# Patient Record
Sex: Male | Born: 1955 | Race: White | Hispanic: No | Marital: Married | Smoking: Never smoker
Health system: Southern US, Community
[De-identification: ages and names within clinical notes are randomized; demographics above are authoritative.]

## PROBLEM LIST (undated history)

## (undated) DIAGNOSIS — Z9889 Other specified postprocedural states: Secondary | ICD-10-CM

## (undated) DIAGNOSIS — R011 Cardiac murmur, unspecified: Secondary | ICD-10-CM

## (undated) DIAGNOSIS — K219 Gastro-esophageal reflux disease without esophagitis: Secondary | ICD-10-CM

## (undated) DIAGNOSIS — N200 Calculus of kidney: Secondary | ICD-10-CM

## (undated) DIAGNOSIS — I10 Essential (primary) hypertension: Secondary | ICD-10-CM

## (undated) DIAGNOSIS — C4401 Basal cell carcinoma of skin of lip: Secondary | ICD-10-CM

## (undated) DIAGNOSIS — E785 Hyperlipidemia, unspecified: Secondary | ICD-10-CM

## (undated) DIAGNOSIS — N390 Urinary tract infection, site not specified: Secondary | ICD-10-CM

## (undated) DIAGNOSIS — J45909 Unspecified asthma, uncomplicated: Secondary | ICD-10-CM

## (undated) DIAGNOSIS — B019 Varicella without complication: Secondary | ICD-10-CM

## (undated) HISTORY — DX: Other specified postprocedural states: Z98.890

## (undated) HISTORY — DX: Cardiac murmur, unspecified: R01.1

## (undated) HISTORY — DX: Essential (primary) hypertension: I10

## (undated) HISTORY — DX: Varicella without complication: B01.9

## (undated) HISTORY — DX: Unspecified asthma, uncomplicated: J45.909

## (undated) HISTORY — DX: Basal cell carcinoma of skin of lip: C44.01

## (undated) HISTORY — DX: Calculus of kidney: N20.0

## (undated) HISTORY — DX: Gastro-esophageal reflux disease without esophagitis: K21.9

## (undated) HISTORY — DX: Urinary tract infection, site not specified: N39.0

## (undated) HISTORY — DX: Hyperlipidemia, unspecified: E78.5

---
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
## 2006-07-31 HISTORY — PX: CARDIAC CATHETERIZATION: SHX172

## 2007-08-01 HISTORY — PX: OTHER SURGICAL HISTORY: SHX169

## 2013-05-07 ENCOUNTER — Encounter: Payer: Self-pay | Admitting: Internal Medicine

## 2013-05-07 ENCOUNTER — Ambulatory Visit (INDEPENDENT_AMBULATORY_CARE_PROVIDER_SITE_OTHER): Payer: 59 | Admitting: Internal Medicine

## 2013-05-07 ENCOUNTER — Encounter (INDEPENDENT_AMBULATORY_CARE_PROVIDER_SITE_OTHER): Payer: Self-pay

## 2013-05-07 VITALS — BP 150/90 | HR 83 | Temp 98.1°F | Ht 70.5 in | Wt 217.0 lb

## 2013-05-07 DIAGNOSIS — K219 Gastro-esophageal reflux disease without esophagitis: Secondary | ICD-10-CM

## 2013-05-07 DIAGNOSIS — E785 Hyperlipidemia, unspecified: Secondary | ICD-10-CM

## 2013-05-07 DIAGNOSIS — R011 Cardiac murmur, unspecified: Secondary | ICD-10-CM | POA: Insufficient documentation

## 2013-05-07 DIAGNOSIS — I10 Essential (primary) hypertension: Secondary | ICD-10-CM | POA: Insufficient documentation

## 2013-05-07 DIAGNOSIS — R030 Elevated blood-pressure reading, without diagnosis of hypertension: Secondary | ICD-10-CM | POA: Insufficient documentation

## 2013-05-07 DIAGNOSIS — Z23 Encounter for immunization: Secondary | ICD-10-CM

## 2013-05-07 DIAGNOSIS — IMO0001 Reserved for inherently not codable concepts without codable children: Secondary | ICD-10-CM | POA: Insufficient documentation

## 2013-05-07 DIAGNOSIS — J45909 Unspecified asthma, uncomplicated: Secondary | ICD-10-CM | POA: Insufficient documentation

## 2013-05-07 NOTE — Progress Notes (Signed)
Chief Complaint  Patient presents with  . Establish Care    Moved from [place] in April.  Seeing his wife.    HPI: Patient comes in as new patient visit . Previous care was  In [place] last labs April  BP this am 136/84  At home    ? Some white coat  Effect.   Moved from  [place]  .   Lipids  For at least 10 years   40 mg  270 total ROS: See pertinent positives and negatives per HPI. Contacts and some color blindness  Has used inhalers before  Seasonal SOB and cold with  Prolonged coughs has had allergy shots in the past.   Better down here.  ? Rad Has gerd.  For 10 years    Endoscope 4 years ago .  No barretts   Hard to wean.  Heart murmur since birth.    Ed visit sob in 2011  Normal  CCath  Hx shot putting  And other exercise. Wine 4-5 per week.  Sibs in [place] well ROS:  GEN/ HEENT: No fever, significant weight changes sweats headaches vision problems hearing changes, CV/ PULM; No chest pain shortness of breath cough, syncope,edema  change in exercise tolerance. GI /GU: No adominal pain, vomiting, change in bowel habits. No blood in the stool. No significant GU symptoms. SKIN/HEME: ,no acute skin rashes suspicious lesions or bleeding. No lymphadenopathy, nodules, masses.  NEURO/ PSYCH:  No neurologic signs such as weakness numbness. No depression anxiety. IMM/ Allergy: No unusual infections. As above  REST of 12 system review negative except as per HPI   Past Medical History  Diagnosis Date  . GERD (gastroesophageal reflux disease)     endo neg x 2? on  chornnic ppi  . Hyperlipidemia     orig  270 ranges  on meds for 10 years   . Asthma     hx allergy shots in [place]   . Chicken pox   . Heart murmur     cardiac cath neg for sob   . Hypertension     white coat   . UTI (lower urinary tract infection)     x 1 with fever   . H/O cardiac catheterization     neg wu for sob  in ed    Past Surgical History  Procedure Laterality Date  . Shoulder repair      2009  .  Tonsillectomy      1963  . Cardiac catheterization       Family History  Problem Relation Age of Onset  . Arthritis Father   . Hyperlipidemia Father   . Aneurysm Father   . Graves' disease Sister     History   Social History  . Marital Status: Married    Spouse Name: N/A    Number of Children: N/A  . Years of Education: N/A   Social History Main Topics  . Smoking status: Never Smoker   . Smokeless tobacco: None  . Alcohol Use: Yes  . Drug Use: None  . Sexual Activity: None   Other Topics Concern  . None   Social History Narrative   7 hours of sleep per night   Patient lives with his wife.   [place] Reason:  Moved from [place].    Job Therapist, nutritional ;. 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   Brother good health  Used to do weigh lifting now does som shot put     Outpatient Encounter Prescriptions as of 05/07/2013  Medication Sig Dispense Refill  . omeprazole (PRILOSEC) 20 MG capsule Take 20 mg by mouth daily.      . simvastatin (ZOCOR) 40 MG tablet Take 40 mg by mouth every evening.       No facility-administered encounter medications on file as of 05/07/2013.    EXAM:  BP 150/90  Pulse 83  Temp(Src) 98.1 F (36.7 C) (Oral)  Ht 5' 10.5" (1.791 m)  Wt 217 lb (98.431 kg)  BMI 30.69 kg/m2  SpO2 98%  Body mass index is 30.69 kg/(m^2). Repeat bp large cuff was 142/90 GENERAL: vitals reviewed and listed above, alert, oriented, appears well hydrated and in no acute distress HEENT: atraumatic, conjunctiva  clear, no obvious abnormalities on inspection of external nose and ears NECK: no obvious masses on inspection palpation  No bruits masses adenopathy  LUNGS: clear to auscultation bilaterally, no wheezes, rales or rhonchi, good air movement CV: HRRR,  No g or m heard  Today no clubbing cyanosis or  peripheral edema nl cap refill  Abdomen:  Sof,t normal bowel sounds without hepatosplenomegaly, no guarding rebound or masses no CVA  tenderness Skin no acute rash some follicular rash MS: moves all extremities without noticeable focal  abnormality PSYCH: pleasant and cooperative, no obvious depression or anxiety  ASSESSMENT AND PLAN:  Discussed the following assessment and plan:  Hyperlipidemia - labs form april 14 at goal   Elevated blood pressure reading  Need for prophylactic vaccination and inoculation against influenza - Plan: Flu Vaccine QUAD 36+ mos PF IM (Fluarix)  GERD (gastroesophageal reflux disease) - on chronic ppi neg endo  Heart murmur - not heard today  neg cath in past  Asthma - rad allergic related?  White coat hypertension  -Patient advised to return or notify health care team  if symptoms worsen or persist or new concerns arise.  Patient Instructions  Check BP readings at least 3 x per week.  More if you would like AND RECORD . Mix aerobic into your  exercise mix.    Agree with healthy weight loss. Repeat bp large cuff is 142/90 Bring your machine and readings for OV in about 6 weeks.     .  How to Take Your Blood Pressure  These instructions are only for electronic home blood pressure machines. You will need:   An automatic or semi-automatic blood pressure machine.  Fresh batteries for the blood pressure machine. HOW DO I USE THESE TOOLS TO CHECK MY BLOOD PRESSURE?   There are 2 numbers that make up your blood pressure. For example: 120/80.  The first number (120 in our example) is called the "systolic pressure." It is a measure of the pressure in your blood vessels when your heart is pumping blood.  The second number (80 in our example) is called the "diastolic pressure." It is a measure of the pressure in your blood vessels when your heart is resting between beats.  Before you buy a home blood pressure machine, check the size of your arm so you can buy the right size cuff. Here is how to check the size of your arm:  Use a tape measure that shows both inches and  centimeters.  Wrap the tape measure around the middle upper part of your arm. You may need someone to help you measure right.  Write down your arm measurement in both inches and centimeters.  To measure your blood pressure right, it is important to have the right size cuff.  If your arm is up to 13 inches (37 to 34 centimeters), get an adult cuff size.  If your arm is 13 to 17 inches (35 to 44 centimeters), get a large adult cuff size.  If your arm is 17 to 20 inches (45 to 52 centimeters), get an adult thigh cuff.  Try to rest or relax for at least 30 minutes before you check your blood pressure.  Do not smoke.  Do not have any drinks with caffeine, such as:  Pop.  Coffee.  Tea.  Check your blood pressure in a quiet room.  Sit down and stretch out your arm on a table. Keep your arm at about the level of your heart. Let your arm relax. GETTING BLOOD PRESSURE READINGS  Make sure you remove any tight-fighting clothing from your arm. Wrap the cuff around your upper arm. Wrap it just above the bend, and above where you felt the pulse. You should be able to slip a finger between the cuff and your arm. If you cannot slip a finger in the cuff, it is too tight and should be removed and rewrapped.  Some units requires you to manually pump up the arm cuff.  Automatic units inflate the cuff when you press a button.  Cuff deflation is automatic in both models.  After the cuff is inflated, the unit measures your blood pressure and pulse. The readings are displayed on a monitor. Hold still and breathe normally while the cuff is inflated.  Getting a reading takes less than a minute.  Some models store readings in a memory. Some provide a printout of readings.  Get readings at different times of the day. You should wait at least 5 minutes between readings. Take readings with you to your next doctor's visit. Document Released: 06/29/2008 Document Revised: 10/09/2011 Document Reviewed:  06/29/2008 [facility] Patient Information 2014 [place], [place].  Managing Your High Blood Pressure Blood pressure is a measurement of how forceful your blood is pressing against the walls of the arteries. Arteries are muscular tubes within the circulatory system. Blood pressure does not stay the same. Blood pressure rises when you are active, excited, or nervous; and it lowers during sleep and relaxation. If the numbers measuring your blood pressure stay above normal most of the time, you are at risk for health problems. High blood pressure (hypertension) is a long-term (chronic) condition in which blood pressure is elevated. A blood pressure reading is recorded as two numbers, such as 120 over 80 (or 120/80). The first, higher number is called the systolic pressure. It is a measure of the pressure in your arteries as the heart beats. The second, lower number is called the diastolic pressure. It is a measure of the pressure in your arteries as the heart relaxes between beats.  Keeping your blood pressure in a normal range is important to your overall health and prevention of health problems, such as heart disease and stroke. When your blood pressure is uncontrolled, your heart has to work harder than normal. High blood pressure is a very common condition in adults because blood pressure tends to rise with age. Men and women are equally likely to have hypertension but at different times in life. Before age 66, men are more likely to have hypertension. After 56 years of age, women are more likely to have it. Hypertension is especially common in African Americans. This condition often has no  signs or symptoms. The cause of the condition is usually not known. Your caregiver can help you come up with a plan to keep your blood pressure in a normal, healthy range. BLOOD PRESSURE STAGES Blood pressure is classified into four stages: normal, prehypertension, stage 1, and stage 2. Your blood pressure reading will be  used to determine what type of treatment, if any, is necessary. Appropriate treatment options are tied to these four stages:  Normal  Systolic pressure (mm Hg): below 120.  Diastolic pressure (mm Hg): below 80. Prehypertension  Systolic pressure (mm Hg): 120 to 139.  Diastolic pressure (mm Hg): 80 to 89. Stage1  Systolic pressure (mm Hg): 140 to 159.  Diastolic pressure (mm Hg): 90 to 99. Stage2  Systolic pressure (mm Hg): 160 or above.  Diastolic pressure (mm Hg): 100 or above. RISKS RELATED TO HIGH BLOOD PRESSURE Managing your blood pressure is an important responsibility. Uncontrolled high blood pressure can lead to:  A heart attack.  A stroke.  A weakened blood vessel (aneurysm).  Heart failure.  Kidney damage.  Eye damage.  Metabolic syndrome.  Memory and concentration problems. HOW TO MANAGE YOUR BLOOD PRESSURE Blood pressure can be managed effectively with lifestyle changes and medicines (if needed). Your caregiver will help you come up with a plan to bring your blood pressure within a normal range. Your plan should include the following: Education  Read all information provided by your caregivers about how to control blood pressure.  Educate yourself on the latest guidelines and treatment recommendations. New research is always being done to further define the risks and treatments for high blood pressure. Lifestylechanges  Control your weight.  Avoid smoking.  Stay physically active.  Reduce the amount of salt in your diet.  Reduce stress.  Control any chronic conditions, such as high cholesterol or diabetes.  Reduce your alcohol intake. Medicines  Several medicines (antihypertensive medicines) are available, if needed, to bring blood pressure within a normal range. Communication  Review all the medicines you take with your caregiver because there may be side effects or interactions.  Talk with your caregiver about your diet, exercise  habits, and other lifestyle factors that may be contributing to high blood pressure.  See your caregiver regularly. Your caregiver can help you create and adjust your plan for managing high blood pressure. RECOMMENDATIONS FOR TREATMENT AND FOLLOW-UP  The following recommendations are based on current guidelines for managing high blood pressure in nonpregnant adults. Use these recommendations to identify the proper follow-up period or treatment option based on your blood pressure reading. You can discuss these options with your caregiver.  Systolic pressure of 120 to 139 or diastolic pressure of 80 to 89: Follow up with your caregiver as directed.  Systolic pressure of 140 to 160 or diastolic pressure of 90 to 100: Follow up with your caregiver within 2 months.  Systolic pressure above 160 or diastolic pressure above 100: Follow up with your caregiver within 1 month.  Systolic pressure above 180 or diastolic pressure above 110: Consider antihypertensive therapy; follow up with your caregiver within 1 week.  Systolic pressure above 200 or diastolic pressure above 120: Begin antihypertensive therapy; follow up with your caregiver within 1 week. Document Released: 04/10/2012 Document Reviewed: 04/10/2012 [facility] Patient Information 2014 [place], [place].      Neta Mends. Panosh M.D.

## 2013-05-07 NOTE — Patient Instructions (Addendum)
Check BP readings at least 3 x per week.  More if you would like AND RECORD . Mix aerobic into your  exercise mix.    Agree with healthy weight loss. Repeat bp large cuff is 142/90 Bring your machine and readings for OV in about 6 weeks.     .  How to Take Your Blood Pressure  These instructions are only for electronic home blood pressure machines. You will need:   An automatic or semi-automatic blood pressure machine.  Fresh batteries for the blood pressure machine. HOW DO I USE THESE TOOLS TO CHECK MY BLOOD PRESSURE?   There are 2 numbers that make up your blood pressure. For example: 120/80.  The first number (120 in our example) is called the "systolic pressure." It is a measure of the pressure in your blood vessels when your heart is pumping blood.  The second number (80 in our example) is called the "diastolic pressure." It is a measure of the pressure in your blood vessels when your heart is resting between beats.  Before you buy a home blood pressure machine, check the size of your arm so you can buy the right size cuff. Here is how to check the size of your arm:  Use a tape measure that shows both inches and centimeters.  Wrap the tape measure around the middle upper part of your arm. You may need someone to help you measure right.  Write down your arm measurement in both inches and centimeters.  To measure your blood pressure right, it is important to have the right size cuff.  If your arm is up to 13 inches (37 to 34 centimeters), get an adult cuff size.  If your arm is 13 to 17 inches (35 to 44 centimeters), get a large adult cuff size.  If your arm is 17 to 20 inches (45 to 52 centimeters), get an adult thigh cuff.  Try to rest or relax for at least 30 minutes before you check your blood pressure.  Do not smoke.  Do not have any drinks with caffeine, such as:  Pop.  Coffee.  Tea.  Check your blood pressure in a quiet room.  Sit down and stretch out your  arm on a table. Keep your arm at about the level of your heart. Let your arm relax. GETTING BLOOD PRESSURE READINGS  Make sure you remove any tight-fighting clothing from your arm. Wrap the cuff around your upper arm. Wrap it just above the bend, and above where you felt the pulse. You should be able to slip a finger between the cuff and your arm. If you cannot slip a finger in the cuff, it is too tight and should be removed and rewrapped.  Some units requires you to manually pump up the arm cuff.  Automatic units inflate the cuff when you press a button.  Cuff deflation is automatic in both models.  After the cuff is inflated, the unit measures your blood pressure and pulse. The readings are displayed on a monitor. Hold still and breathe normally while the cuff is inflated.  Getting a reading takes less than a minute.  Some models store readings in a memory. Some provide a printout of readings.  Get readings at different times of the day. You should wait at least 5 minutes between readings. Take readings with you to your next doctor's visit. Document Released: 06/29/2008 Document Revised: 10/09/2011 Document Reviewed: 06/29/2008 [facility] Patient Information 2014 [place], [place].  Managing Your High Blood  Pressure Blood pressure is a measurement of how forceful your blood is pressing against the walls of the arteries. Arteries are muscular tubes within the circulatory system. Blood pressure does not stay the same. Blood pressure rises when you are active, excited, or nervous; and it lowers during sleep and relaxation. If the numbers measuring your blood pressure stay above normal most of the time, you are at risk for health problems. High blood pressure (hypertension) is a long-term (chronic) condition in which blood pressure is elevated. A blood pressure reading is recorded as two numbers, such as 120 over 80 (or 120/80). The first, higher number is called the systolic pressure. It is a  measure of the pressure in your arteries as the heart beats. The second, lower number is called the diastolic pressure. It is a measure of the pressure in your arteries as the heart relaxes between beats.  Keeping your blood pressure in a normal range is important to your overall health and prevention of health problems, such as heart disease and stroke. When your blood pressure is uncontrolled, your heart has to work harder than normal. High blood pressure is a very common condition in adults because blood pressure tends to rise with age. Men and women are equally likely to have hypertension but at different times in life. Before age 66, men are more likely to have hypertension. After 56 years of age, women are more likely to have it. Hypertension is especially common in African Americans. This condition often has no signs or symptoms. The cause of the condition is usually not known. Your caregiver can help you come up with a plan to keep your blood pressure in a normal, healthy range. BLOOD PRESSURE STAGES Blood pressure is classified into four stages: normal, prehypertension, stage 1, and stage 2. Your blood pressure reading will be used to determine what type of treatment, if any, is necessary. Appropriate treatment options are tied to these four stages:  Normal  Systolic pressure (mm Hg): below 120.  Diastolic pressure (mm Hg): below 80. Prehypertension  Systolic pressure (mm Hg): 120 to 139.  Diastolic pressure (mm Hg): 80 to 89. Stage1  Systolic pressure (mm Hg): 140 to 159.  Diastolic pressure (mm Hg): 90 to 99. Stage2  Systolic pressure (mm Hg): 160 or above.  Diastolic pressure (mm Hg): 100 or above. RISKS RELATED TO HIGH BLOOD PRESSURE Managing your blood pressure is an important responsibility. Uncontrolled high blood pressure can lead to:  A heart attack.  A stroke.  A weakened blood vessel (aneurysm).  Heart failure.  Kidney damage.  Eye damage.  Metabolic  syndrome.  Memory and concentration problems. HOW TO MANAGE YOUR BLOOD PRESSURE Blood pressure can be managed effectively with lifestyle changes and medicines (if needed). Your caregiver will help you come up with a plan to bring your blood pressure within a normal range. Your plan should include the following: Education  Read all information provided by your caregivers about how to control blood pressure.  Educate yourself on the latest guidelines and treatment recommendations. New research is always being done to further define the risks and treatments for high blood pressure. Lifestylechanges  Control your weight.  Avoid smoking.  Stay physically active.  Reduce the amount of salt in your diet.  Reduce stress.  Control any chronic conditions, such as high cholesterol or diabetes.  Reduce your alcohol intake. Medicines  Several medicines (antihypertensive medicines) are available, if needed, to bring blood pressure within a normal range. Communication  Review  all the medicines you take with your caregiver because there may be side effects or interactions.  Talk with your caregiver about your diet, exercise habits, and other lifestyle factors that may be contributing to high blood pressure.  See your caregiver regularly. Your caregiver can help you create and adjust your plan for managing high blood pressure. RECOMMENDATIONS FOR TREATMENT AND FOLLOW-UP  The following recommendations are based on current guidelines for managing high blood pressure in nonpregnant adults. Use these recommendations to identify the proper follow-up period or treatment option based on your blood pressure reading. You can discuss these options with your caregiver.  Systolic pressure of 120 to 139 or diastolic pressure of 80 to 89: Follow up with your caregiver as directed.  Systolic pressure of 140 to 160 or diastolic pressure of 90 to 100: Follow up with your caregiver within 2 months.  Systolic  pressure above 160 or diastolic pressure above 100: Follow up with your caregiver within 1 month.  Systolic pressure above 180 or diastolic pressure above 110: Consider antihypertensive therapy; follow up with your caregiver within 1 week.  Systolic pressure above 200 or diastolic pressure above 120: Begin antihypertensive therapy; follow up with your caregiver within 1 week. Document Released: 04/10/2012 Document Reviewed: 04/10/2012 [facility] Patient Information 2014 [place], [place].

## 2013-06-18 ENCOUNTER — Ambulatory Visit (INDEPENDENT_AMBULATORY_CARE_PROVIDER_SITE_OTHER): Payer: 59 | Admitting: Internal Medicine

## 2013-06-18 ENCOUNTER — Encounter: Payer: Self-pay | Admitting: Internal Medicine

## 2013-06-18 VITALS — BP 144/90 | HR 75 | Temp 98.5°F | Wt 219.0 lb

## 2013-06-18 DIAGNOSIS — IMO0001 Reserved for inherently not codable concepts without codable children: Secondary | ICD-10-CM

## 2013-06-18 DIAGNOSIS — R030 Elevated blood-pressure reading, without diagnosis of hypertension: Secondary | ICD-10-CM

## 2013-06-18 DIAGNOSIS — I10 Essential (primary) hypertension: Secondary | ICD-10-CM

## 2013-06-18 NOTE — Assessment & Plan Note (Signed)
Review of  Log are basically normal readings Current machine and readings correlate with office readings. Adequate for making clinical decisions at this time. Follow

## 2013-06-18 NOTE — Progress Notes (Signed)
Chief Complaint  Patient presents with  . Follow-up    HPI: Patient comes in today as instructed for followup of his blood pressure readings felt to be white coat hypertension he brings in his monitor and list of readings orders associated pulse. He has 38 readings average left arm 1 34/81 average right arm 124/79 and pulse of 72. He has his machine with him today they're 7 readings above the 140/90 threshold. ROS: See pertinent positives and negatives per HPI. No chest pain shortness of breath feels well. Trying to walk more. Does still do lifting. States the only other time his blood pressure goes up his right is he gets ready to compete. After exercising however his blood pressures very good.  Past Medical History  Diagnosis Date  . GERD (gastroesophageal reflux disease)     endo neg x 2? on  chornnic ppi  . Hyperlipidemia     orig  270 ranges  on meds for 10 years   . Asthma     hx allergy shots in [place]   . Chicken pox   . Heart murmur     cardiac cath neg for sob   . Hypertension     white coat   . UTI (lower urinary tract infection)     x 1 with fever   . H/O cardiac catheterization     neg wu for sob  in ed     Family History  Problem Relation Age of Onset  . Arthritis Father   . Hyperlipidemia Father   . Aneurysm Father   . Graves' disease Sister     History   Social History  . Marital Status: Married    Spouse Name: N/A    Number of Children: N/A  . Years of Education: N/A   Social History Main Topics  . Smoking status: Never Smoker   . Smokeless tobacco: None  . Alcohol Use: Yes  . Drug Use: None  . Sexual Activity: None   Other Topics Concern  . None   Social History Narrative   7 hours of sleep per night   Patient lives with his wife.   Roylene Reason:  Moved from [place].    Job Therapist, nutritional ;. 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   Brother good health    Used to do weigh lifting now does som shot put      Outpatient Encounter Prescriptions as of 06/18/2013  Medication Sig  . omeprazole (PRILOSEC) 20 MG capsule Take 20 mg by mouth daily.  . simvastatin (ZOCOR) 40 MG tablet Take 40 mg by mouth every evening.    EXAM:  BP 144/90  Pulse 75  Temp(Src) 98.5 F (36.9 C) (Oral)  Wt 219 lb (99.338 kg)  SpO2 98%  Body mass index is 30.97 kg/(m^2).  GENERAL: vitals reviewed and listed above, alert, oriented, appears well hydrated and in no acute distress  Log reviewed blood pressure readings taken His machine left arm sitting 160/100 his machine second reading office machine 1 54/ 96. MS: moves all extremities without noticeable focal  abnormality PSYCH: pleasant and cooperative, no obvious depression or anxiety  ASSESSMENT AND PLAN:  Discussed the following assessment and plan:  Elevated blood pressure reading  White coat hypertension His machine his readings are certainly not under reading compared office reading. Therefore I feel that his home readings are accurate and precise enough to me clinical decisions. Since the majority of  his readings are normal as well as average normal we'll not add medication at this time continue lifestyle intervention periodic monitoring and contact us if elevations. Discussed implications of white coat hypertension that is not always the lowest risk. Plan preventive visit in April with labs contact us in the meantime if problems. -Patient advised to return or notify health care team  if symptoms worsen or persist or new concerns arise.  Patient Instructions  Monitor BP readings sporadically.    Contact us if rising abnormal.  otherwise Continue lifestyle intervention healthy eating and exercise . And wellness visit and  Labs then.    Neta Mends. Bonni Neuser M.D.  Pre visit review using our clinic review tool, if applicable. No additional management support is needed unless otherwise documented below in the visit note.

## 2013-06-18 NOTE — Patient Instructions (Signed)
Monitor BP readings sporadically.    Contact us if rising abnormal.  otherwise Continue lifestyle intervention healthy eating and exercise . And wellness visit and  Labs then.

## 2013-08-01 ENCOUNTER — Other Ambulatory Visit: Payer: Self-pay | Admitting: Family Medicine

## 2013-08-01 ENCOUNTER — Encounter: Payer: Self-pay | Admitting: Internal Medicine

## 2013-08-01 MED ORDER — OMEPRAZOLE 20 MG PO CPDR: 20.0000 mg | DELAYED_RELEASE_CAPSULE | Freq: Every day | ORAL | Status: DC

## 2013-08-01 MED ORDER — SIMVASTATIN 40 MG PO TABS: 40.0000 mg | ORAL_TABLET | Freq: Every evening | ORAL | Status: DC

## 2013-11-10 ENCOUNTER — Other Ambulatory Visit (INDEPENDENT_AMBULATORY_CARE_PROVIDER_SITE_OTHER): Payer: 59

## 2013-11-10 DIAGNOSIS — Z0000 Encounter for general adult medical examination without abnormal findings: Secondary | ICD-10-CM

## 2013-11-10 LAB — LIPID PANEL
Cholesterol: 150 mg/dL (ref 0–200)
HDL: 36.8 mg/dL — ABNORMAL LOW (ref >39.00)
LDL Cholesterol: 92 mg/dL (ref 0–99)
TRIGLYCERIDES: 104 mg/dL (ref 0.0–149.0)
Total CHOL/HDL Ratio: 4
VLDL: 20.8 mg/dL (ref 0.0–40.0)

## 2013-11-10 LAB — CBC WITH DIFFERENTIAL/PLATELET
BASOS ABS: 0 10*3/uL (ref 0.0–0.1)
Basophils Relative: 0.2 % (ref 0.0–3.0)
Eosinophils Absolute: 0.2 10*3/uL (ref 0.0–0.7)
Eosinophils Relative: 2.3 % (ref 0.0–5.0)
HCT: 42.8 % (ref 39.0–52.0)
HEMOGLOBIN: 14.5 g/dL (ref 13.0–17.0)
LYMPHS ABS: 1.6 10*3/uL (ref 0.7–4.0)
Lymphocytes Relative: 22.7 % (ref 12.0–46.0)
MCHC: 33.8 g/dL (ref 30.0–36.0)
MCV: 93 fl (ref 78.0–100.0)
MONO ABS: 0.7 10*3/uL (ref 0.1–1.0)
Monocytes Relative: 10.1 % (ref 3.0–12.0)
NEUTROS ABS: 4.6 10*3/uL (ref 1.4–7.7)
Neutrophils Relative %: 64.7 % (ref 43.0–77.0)
PLATELETS: 293 10*3/uL (ref 150.0–400.0)
RBC: 4.61 Mil/uL (ref 4.22–5.81)
RDW: 12.7 % (ref 11.5–14.6)
WBC: 7.2 10*3/uL (ref 4.5–10.5)

## 2013-11-10 LAB — HEPATIC FUNCTION PANEL
ALT: 22 U/L (ref 0–53)
AST: 29 U/L (ref 0–37)
Albumin: 4.1 g/dL (ref 3.5–5.2)
Alkaline Phosphatase: 56 U/L (ref 39–117)
Bilirubin, Direct: 0.1 mg/dL (ref 0.0–0.3)
Total Bilirubin: 0.7 mg/dL (ref 0.3–1.2)
Total Protein: 7.2 g/dL (ref 6.0–8.3)

## 2013-11-10 LAB — BASIC METABOLIC PANEL
BUN: 23 mg/dL (ref 6–23)
CO2: 27 meq/L (ref 19–32)
Calcium: 9.3 mg/dL (ref 8.4–10.5)
Chloride: 104 mEq/L (ref 96–112)
Creatinine, Ser: 1 mg/dL (ref 0.4–1.5)
GFR: 79.97 mL/min (ref >60.00)
GLUCOSE: 108 mg/dL — AB (ref 70–99)
POTASSIUM: 4.8 meq/L (ref 3.5–5.1)
SODIUM: 141 meq/L (ref 135–145)

## 2013-11-10 LAB — PSA: PSA: 0.38 ng/mL (ref 0.10–4.00)

## 2013-11-10 LAB — TSH: TSH: 3.25 u[IU]/mL (ref 0.35–5.50)

## 2013-11-17 ENCOUNTER — Encounter: Payer: Self-pay | Admitting: Internal Medicine

## 2013-11-17 ENCOUNTER — Ambulatory Visit (INDEPENDENT_AMBULATORY_CARE_PROVIDER_SITE_OTHER): Payer: 59 | Admitting: Internal Medicine

## 2013-11-17 VITALS — BP 146/90 | HR 68 | Temp 98.3°F | Ht 70.0 in | Wt 223.0 lb

## 2013-11-17 DIAGNOSIS — I10 Essential (primary) hypertension: Secondary | ICD-10-CM

## 2013-11-17 DIAGNOSIS — M7750 Other enthesopathy of unspecified foot: Secondary | ICD-10-CM

## 2013-11-17 DIAGNOSIS — E785 Hyperlipidemia, unspecified: Secondary | ICD-10-CM

## 2013-11-17 DIAGNOSIS — IMO0001 Reserved for inherently not codable concepts without codable children: Secondary | ICD-10-CM

## 2013-11-17 DIAGNOSIS — L570 Actinic keratosis: Secondary | ICD-10-CM

## 2013-11-17 DIAGNOSIS — E786 Lipoprotein deficiency: Secondary | ICD-10-CM | POA: Insufficient documentation

## 2013-11-17 DIAGNOSIS — K219 Gastro-esophageal reflux disease without esophagitis: Secondary | ICD-10-CM

## 2013-11-17 DIAGNOSIS — Z23 Encounter for immunization: Secondary | ICD-10-CM

## 2013-11-17 DIAGNOSIS — J45909 Unspecified asthma, uncomplicated: Secondary | ICD-10-CM | POA: Insufficient documentation

## 2013-11-17 DIAGNOSIS — Z0000 Encounter for general adult medical examination without abnormal findings: Secondary | ICD-10-CM | POA: Insufficient documentation

## 2013-11-17 HISTORY — DX: Other enthesopathy of unspecified foot and ankle: M77.50

## 2013-11-17 MED ORDER — ALBUTEROL SULFATE HFA 108 (90 BASE) MCG/ACT IN AERS: 2.0000 | INHALATION_SPRAY | Freq: Four times a day (QID) | RESPIRATORY_TRACT | Status: DC | PRN

## 2013-11-17 NOTE — Progress Notes (Signed)
Chief Complaint  Patient presents with  . Annual Exam    Has had a pain in the back of his ankle since January.  Pain is worse in the morning and gets better through the day.  Wraps his ankle when working out.  Would like an albuterol inhaler to have just in case.  Currently has no symptoms.    HPI: Patient comes in today for Preventive Health Care visit  med conditions ;  Lipids no se of meds  Gi weaning prilosec not daily    Albuterol inhaler if needed no need for almost a year   Right heel pain and swelling  Does Shot put.  Right  Retrocalcaneal area .  Feels  Tender  And more and not excruciating but ongoing from January.    trying calf raises ? If could be achilles isse and what to do next   Bp ahd been good at home 120- 130  Wt Readings from Last 3 Encounters:  11/17/13 223 lb (101.152 kg)  06/18/13 219 lb (99.338 kg)  05/07/13 217 lb (98.431 kg)     Health Maintenance  Topic Date Due  . Influenza Vaccine  02/28/2014  . Colonoscopy  07/31/2016  . Tetanus/tdap  11/18/2023   Health Maintenance Review   ROS:  GEN/ HEENT: No fever, significant weight changes sweats headaches vision problems hearing changes, CV/ PULM; No chest pain shortness of breath cough, syncope,edema  change in exercise tolerance. GI /GU: No adominal pain, vomiting, change in bowel habits. No blood in the stool. No significant GU symptoms. SKIN/HEME: ,no acute skin rashes suspicious lesions or bleeding. No lymphadenopathy, nodules, masses.  NEURO/ PSYCH:  No neurologic signs such as weakness numbness. No depression anxiety. IMM/ Allergy: No unusual infections.  Allergy .   REST of 12 system review negative except as per HPI   Past Medical History  Diagnosis Date  . GERD (gastroesophageal reflux disease)     endo neg x 2? on  chornnic ppi  . Hyperlipidemia     orig  270 ranges  on meds for 10 years   . Asthma     hx allergy shots in [place]   . Chicken pox   . Heart murmur     cardiac cath  neg for sob   . Hypertension     white coat   . UTI (lower urinary tract infection)     x 1 with fever   . H/O cardiac catheterization     neg wu for sob  in ed     Family History  Problem Relation Age of Onset  . Arthritis Father   . Hyperlipidemia Father   . Aneurysm Father   . Graves' disease Sister     History   Social History  . Marital Status: Married    Spouse Name: N/A    Number of Children: N/A  . Years of Education: N/A   Social History Main Topics  . Smoking status: Never Smoker   . Smokeless tobacco: Never Used  . Alcohol Use: Yes  . Drug Use: None  . Sexual Activity: None   Other Topics Concern  . None   Social History Narrative   7 hours of sleep per night   Patient lives with his wife.   [name]:  Moved from [place].    Job Conservation officer, historic buildings ;. 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   Brother good health  Used to do weigh lifting now does som shot put     Outpatient Encounter Prescriptions as of 11/17/2013  Medication Sig  . omeprazole (PRILOSEC) 20 MG capsule Take 1 capsule (20 mg total) by mouth daily.  . simvastatin (ZOCOR) 40 MG tablet Take 1 tablet (40 mg total) by mouth every evening.  Marland Kitchen albuterol (PROVENTIL HFA;VENTOLIN HFA) 108 (90 BASE) MCG/ACT inhaler Inhale 2 puffs into the lungs every 6 (six) hours as needed for wheezing or shortness of breath. Generic albuterol HFA please    EXAM:  BP 146/90  Pulse 68  Temp(Src) 98.3 F (36.8 C) (Oral)  Ht 5\' 10"  (1.778 m)  Wt 223 lb (101.152 kg)  BMI 32.00 kg/m2  SpO2 98%  Body mass index is 32 kg/(m^2).  Physical Exam: Vital signs reviewed KWI:OXBD is a well-developed well-nourished alert cooperative    who appearsr stated age in no acute distress.  HEENT: normocephalic atraumatic , Eyes: PERRL EOM's full, conjunctiva clear, Nares: paten,t no deformity discharge or tenderness., Ears: no deformity EAC's clear TMs with normal landmarks. Mouth: clear OP, no  lesions, edema.  Moist mucous membranes. Dentition in adequate repair. NECK: supple without masses, thyromegaly or bruits. CHEST/PULM:  Clear to auscultation and percussion breath sounds equal no wheeze , rales or rhonchi. No chest wall deformities or tenderness. CV: PMI is nondisplaced, S1 S2 no gallops, murmurs, rubs. Peripheral pulses are full without delay.No JVD .  ABDOMEN: Bowel sounds normal nontender  No guard or rebound, no hepato splenomegal no CVA tenderness.  No hernia. Extremtities:  No clubbing cyanosis or edema, no acute joint swelling or redness no focal atrophyright heel  midly swollen retocalcaneal area  High arched feet . No lesion  NEURO:  Oriented x3, cranial nerves 3-12 appear to be intact, no obvious focal weakness,gait within normal limits no abnormal reflexes or asymmetrical SKIN: normal turgor, color, no bruising or petechiae. Right forehead and scalp lare area of blotching pink scaly  PSYCH: Oriented, good eye contact, no obvious depression anxiety, cognition and judgment appear normal. LN: no cervical axillary inguinal adenopathy Rectal tag.   Prostate nl - 1+ no nodules  Nl no masses.   Lab Results  Component Value Date   WBC 7.2 11/10/2013   HGB 14.5 11/10/2013   HCT 42.8 11/10/2013   PLT 293.0 11/10/2013   GLUCOSE 108* 11/10/2013   CHOL 150 11/10/2013   TRIG 104.0 11/10/2013   HDL 36.80* 11/10/2013   LDLCALC 92 11/10/2013   ALT 22 11/10/2013   AST 29 11/10/2013   NA 141 11/10/2013   K 4.8 11/10/2013   CL 104 11/10/2013   CREATININE 1.0 11/10/2013   BUN 23 11/10/2013   CO2 27 11/10/2013   TSH 3.25 11/10/2013   PSA 0.38 11/10/2013    ASSESSMENT AND PLAN:  Discussed the following assessment and plan:  Visit for preventive health examination  Need for Tdap vaccination - Plan: Tdap vaccine greater than or equal to 7yo IM  Retrocalcaneal bursitis? right  - Plan: Ambulatory referral to Sports Medicine  White coat hypertension - docmented in past readings at home in  120  130 range  Unspecified asthma(493.90) - stable rx albuterol if needed  pollen can flare  Low HDL (under 40)  Hyperlipidemia  Actinic keratosis of multiple sites of head and neck ? - multipl eareas to check  get derm opinon about dx and managment  - Plan: Ambulatory referral to Dermatology  GERD (gastroesophageal reflux disease) - weaning med  diet  management  Patient Care Team: Burnis Medin, MD as PCP - General (Internal Medicine) Patient Instructions  Will plan  Referral to sports medicine .about the right   Heel. Some healthy weight loss  And continued exercise  Avoiding transfats and simple carbs  Are heart healthy.  Areas on forehead right scalp  Could be sunc changes  Poss pre cancer vs other . May want to see dermatology about best treatment  Certainly wear hats and sunscreen.   Your exam otherwise normal . Continue monitor blood pressure periodically  to be sure still healthy range ( below 140/90) Yearly check up with labs advised .   Ok to try weaning the omeprazole .   Carlos Gaines. Carlos Gaines M.D.   Pre visit review using our clinic review tool, if applicable. No additional management support is needed unless otherwise documented below in the visit note. BP Readings from Last 3 Encounters:  11/17/13 146/90  06/18/13 144/90  05/07/13 150/90

## 2013-11-17 NOTE — Patient Instructions (Addendum)
Will plan  Referral to sports medicine .about the right   Heel. Some healthy weight loss  And continued exercise  Avoiding transfats and simple carbs  Are heart healthy.  Areas on forehead right scalp  Could be sunc changes  Poss pre cancer vs other . May want to see dermatology about best treatment  Certainly wear hats and sunscreen.   Your exam otherwise normal . Continue monitor blood pressure periodically  to be sure still healthy range ( below 140/90) Yearly check up with labs advised .   Ok to try weaning the omeprazole .

## 2013-11-18 ENCOUNTER — Telehealth: Payer: Self-pay | Admitting: Internal Medicine

## 2013-11-18 NOTE — Telephone Encounter (Signed)
Relevant patient education assigned to patient using Emmi. ° °

## 2013-11-28 ENCOUNTER — Ambulatory Visit (INDEPENDENT_AMBULATORY_CARE_PROVIDER_SITE_OTHER): Payer: 59 | Admitting: Family Medicine

## 2013-11-28 ENCOUNTER — Encounter: Payer: Self-pay | Admitting: Family Medicine

## 2013-11-28 ENCOUNTER — Other Ambulatory Visit (INDEPENDENT_AMBULATORY_CARE_PROVIDER_SITE_OTHER): Payer: 59

## 2013-11-28 VITALS — BP 112/82 | HR 80

## 2013-11-28 DIAGNOSIS — M79609 Pain in unspecified limb: Secondary | ICD-10-CM

## 2013-11-28 DIAGNOSIS — M79671 Pain in right foot: Secondary | ICD-10-CM

## 2013-11-28 DIAGNOSIS — M7750 Other enthesopathy of unspecified foot: Secondary | ICD-10-CM

## 2013-11-28 DIAGNOSIS — M7670 Peroneal tendinitis, unspecified leg: Secondary | ICD-10-CM | POA: Insufficient documentation

## 2013-11-28 MED ORDER — MELOXICAM 15 MG PO TABS: 15.0000 mg | ORAL_TABLET | Freq: Every day | ORAL | Status: DC

## 2013-11-28 NOTE — Progress Notes (Signed)
[name] [address], [place] [address] Phone: [phone] Subjective:    I'm seeing this patient by the request  of:  Lottie Dawson, MD   CC: Right heel pain  [id number] Carlos Gaines is a 58 y.o. male coming in with complaint of right heel pain. Patient states that this has been approximately 3-4 weeks. 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 Patient states this is a dull aching pain in his only bad with explosive activity. Patient states regular walking does not seem to give him significant pain and denies any pain at rest. Reservoirs a dull aching the can be sharp from time to time. Has not stopped him from too much training but he is actually augmented his training secondary to the pain. Patient has changed shoes recently which has seemed to help as well. Rates the pain as 6/10 in severity. Patient like to continue training is here for further evaluation.     Past medical history, social, surgical and family history all reviewed in electronic medical record.   Review of Systems: No headache, visual changes, nausea, vomiting, diarrhea, constipation, dizziness, abdominal pain, skin rash, fevers, chills, night sweats, weight loss, swollen lymph nodes, body aches, joint swelling, muscle aches, chest pain, shortness of breath, mood changes.   Objective Blood pressure 112/82, pulse 80, SpO2 94.00%.  General: No apparent distress alert and oriented x3 mood and affect normal, dressed appropriately.  HEENT: Pupils equal, extraocular movements intact  Respiratory: Patient's speak in full sentences and does not appear short of breath  Cardiovascular: No lower extremity edema, non tender, no erythema  Skin: Warm dry intact with no signs of infection or rash on extremities or on axial skeleton.  Abdomen: Soft  nontender  Neuro: Cranial nerves II through XII are intact, neurovascularly intact in all extremities with 2+ DTRs and 2+ pulses.  Lymph: No lymphadenopathy of posterior or anterior cervical chain or axillae bilaterally.  Gait normal with good balance and coordination.  MSK:  Non tender with full range of motion and good stability and symmetric strength and tone of shoulders, elbows, wrist, hip, knees bilaterally.  Ankle: Right ankle No visible erythema or swelling. Range of motion is full in all directions. Strength is 5/5 in all directions. Stable lateral and medial ligaments; squeeze test and kleiger test unremarkable; Talar dome nontender; No pain at base of 5th MT; No tenderness over cuboid; No tenderness over N spot or navicular prominence No tenderness on posterior aspects of lateral and medial malleolus No sign of peroneal tendon subluxations but does have tenderness to palpation Negative tarsal tunnel tinel's Able to walk 4 steps.  MSK US performed of: Right ankle This study was ordered, performed, and interpreted by Charlann Boxer D.O.  Foot/Ankle:   All structures visualized.   Talar dome unremarkable  Ankle mortise without effusion. Peroneus longus and brevis tendons show significant tendon sheath inflammation but no tear appreciated Posterior tibialis, flexor hallucis longus, and flexor digitorum longus tendons unremarkable on long and transverse views without sheath effusions. Achilles tendon visualized along length of tendon and unremarkable on long and transverse views without sheath effusion. Anterior Talofibular Ligament and Calcaneofibular Ligaments unremarkable and intact. Deltoid Ligament unremarkable and intact. Plantar fascia intact and without effusion, normal thickness. No increased doppler signal, cap sign, or thickening of tibial cortex. Power doppler signal  normal.  IMPRESSION:  Peroneal tendinitis     Impression and Recommendations:     This case  required medical decision making of moderate complexity.

## 2013-11-28 NOTE — Assessment & Plan Note (Signed)
Patient actually has what appears to be more of a peroneal tendinitis. We gave patient a heel lift in his shoe to try to decrease the amount of force on the posterior capsule. In addition to this we were to do a dose of anti-inflammatories, icing, and home exercises. Patient will try these interventions and come back again in 3-4 weeks.

## 2013-11-28 NOTE — Patient Instructions (Signed)
Great to meet you Ice baths 20 minutes 2 times day meloxicam daily for 10 days then as needed exercises most days of the week.  Come back in 3-4 weeks.

## 2013-12-10 ENCOUNTER — Other Ambulatory Visit: Payer: Self-pay | Admitting: Internal Medicine

## 2013-12-30 ENCOUNTER — Other Ambulatory Visit (INDEPENDENT_AMBULATORY_CARE_PROVIDER_SITE_OTHER): Payer: 59

## 2013-12-30 ENCOUNTER — Encounter: Payer: Self-pay | Admitting: Family Medicine

## 2013-12-30 ENCOUNTER — Ambulatory Visit (INDEPENDENT_AMBULATORY_CARE_PROVIDER_SITE_OTHER): Payer: 59 | Admitting: Family Medicine

## 2013-12-30 VITALS — BP 130/84 | HR 68 | Ht 70.0 in | Wt 228.0 lb

## 2013-12-30 DIAGNOSIS — M7520 Bicipital tendinitis, unspecified shoulder: Secondary | ICD-10-CM

## 2013-12-30 DIAGNOSIS — M7750 Other enthesopathy of unspecified foot: Secondary | ICD-10-CM

## 2013-12-30 DIAGNOSIS — M25512 Pain in left shoulder: Secondary | ICD-10-CM

## 2013-12-30 DIAGNOSIS — M25519 Pain in unspecified shoulder: Secondary | ICD-10-CM

## 2013-12-30 DIAGNOSIS — M7522 Bicipital tendinitis, left shoulder: Secondary | ICD-10-CM | POA: Insufficient documentation

## 2013-12-30 DIAGNOSIS — M7670 Peroneal tendinitis, unspecified leg: Secondary | ICD-10-CM

## 2013-12-30 NOTE — Assessment & Plan Note (Signed)
Patient is doing very well at this time. Patient will continue exercises 3 times a week for the next 6 weeks. We discussed continuing the icing regimen as well. Patient in followup as needed.

## 2013-12-30 NOTE — Progress Notes (Signed)
[facility] [address], [place] [address] Phone: [phone] Subjective:     CC: Right heel pain followup  [id number] Carlos Gaines is a 58 y.o. male coming in with complaint of right heel pain. Patient was seen previously and was diagnosed with peroneal tendinitis. Patient was given a heel lift, home exercise program, icing protocol as well as anti-inflammatories. Patient states that his ankle is 99% better. Patient has been doing the exercises as well as doing some balancing techniques at work at his desk. Patient states it is not really giving him any pain and no swelling at this time. Patient is very happy with the. Tissue though does come in with a new problem. States that his left shoulder has some pain. Patient states when he extends his arm all the way back he has this popping sensation that does give a dull pain mostly on the anterior aspect of the shoulder. Patient does have a past medical history significant for acromioplasty. Patient states that there is no radiation down his arm work towards his neck. Denies any numbness or weakness. Patient notices the pain more when he is doing such things as bench press. 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       Past medical history, social, surgical and family history all reviewed in electronic medical record.   Review of Systems: No headache, visual changes, nausea, vomiting, diarrhea, constipation, dizziness, abdominal pain, skin rash, fevers, chills, night sweats, weight loss, swollen lymph nodes, body aches, joint swelling, muscle aches, chest pain, shortness of breath, mood changes.   Objective Blood pressure 130/84, pulse 68, height 5\' 10"  (1.778 m), weight 228 lb (103.42 kg), SpO2 95.00%.  General: No apparent distress alert and oriented x3 mood and affect normal, dressed appropriately.  HEENT: Pupils equal, extraocular movements intact  Respiratory: Patient's speak in full sentences and  does not appear short of breath  Cardiovascular: No lower extremity edema, non tender, no erythema  Skin: Warm dry intact with no signs of infection or rash on extremities or on axial skeleton.  Abdomen: Soft nontender  Neuro: Cranial nerves II through XII are intact, neurovascularly intact in all extremities with 2+ DTRs and 2+ pulses.  Lymph: No lymphadenopathy of posterior or anterior cervical chain or axillae bilaterally.  Gait normal with good balance and coordination.  MSK:  Non tender with full range of motion and good stability and symmetric strength and tone of elbows, wrist, hip, knees bilaterally.  Ankle: Right ankle No visible erythema or swelling. Range of motion is full in all directions. Strength is 5/5 in all directions. Stable lateral and medial ligaments; squeeze test and kleiger test unremarkable; Talar dome nontender; No pain at base of 5th MT; No tenderness over cuboid; No tenderness over N spot or navicular prominence No tenderness on posterior aspects of lateral and medial malleolus No sign of peroneal tendon subluxations  and no tenderness Negative tarsal tunnel tinel's Able to walk 4 steps. Contralateral ankle unremarkable  Shoulder: Left Inspection reveals no abnormalities, atrophy or asymmetry. Palpation is normal with no tenderness over AC joint but mild over bicipital groove. ROM is full in all planes. Rotator cuff strength normal throughout. No signs of impingement with negative Neer and Hawkin's tests, empty can sign. Speeds and Yergason's tests mildly positive. No labral pathology noted with negative Obrien's, negative clunk and good stability. Normal scapular function observed. No painful arc and no drop arm sign. No apprehension sign Contralateral shoulder unremarkable  MSK US performed of: Right  This study was ordered, performed, and interpreted by Charlann Boxer D.O.  Shoulder:   Supraspinatus:  Appears normal on long and transverse views, no  bursal bulge seen with shoulder abduction on impingement view. Infraspinatus:  Appears normal on long and transverse views. Subscapularis:  Appears normal on long and transverse views. Teres Minor:  Appears normal on long and transverse views. AC joint:  Capsule undistended, no geyser sign. Glenohumeral Joint:  Appears normal without effusion. Glenoid Labrum:  Intact without visualized tears. Biceps Tendon:  Does not have any tearing the patient does have some mild hypoechoic changes in this area. With strength testing patient does have some mild subluxation of the bicep tendon from the bicipital groove. Transverse ligament appears to be torn.  Impression: Bicipital subluxation      Impression and Recommendations:     This case required medical decision making of moderate complexity.

## 2013-12-30 NOTE — Assessment & Plan Note (Signed)
Patient is excellent having subluxation of the bicep. Patient is asked about the severity of the pain he states it is 2/10. Patient will try compression sleeve and was given home exercise program. We discussed icing and other anti-inflammatories. Patient did have some bleeding of the gums who told him to stop that and try more natural supplementation. Patient is going to try these interventions and come back again in 3 weeks for further evaluation.  Spent greater than 25 minutes with patient face-to-face and had greater than 50% of counseling including as described above in assessment and plan.

## 2013-12-30 NOTE — Patient Instructions (Addendum)
Good to see you Continue exercises 3 times a week for next 6 weeks for ankle For you bicep subluxation Get a compression sleeve for the left arm with activity .  Exercises 3 times a week.  On bench keep elbows at 90 degrees.  Never let your hand out of your periphery  In 3 weeks if not perfect then come back.

## 2014-01-20 ENCOUNTER — Ambulatory Visit (INDEPENDENT_AMBULATORY_CARE_PROVIDER_SITE_OTHER)
Admission: RE | Admit: 2014-01-20 | Discharge: 2014-01-20 | Disposition: A | Discharge status: Home / Self Care | Payer: 59 | Source: Ambulatory Visit | Attending: Family Medicine | Admitting: Family Medicine

## 2014-01-20 ENCOUNTER — Encounter: Payer: Self-pay | Admitting: Family Medicine

## 2014-01-20 ENCOUNTER — Ambulatory Visit (INDEPENDENT_AMBULATORY_CARE_PROVIDER_SITE_OTHER): Payer: 59 | Admitting: Family Medicine

## 2014-01-20 VITALS — BP 130/80 | HR 87 | Ht 70.0 in | Wt 229.0 lb

## 2014-01-20 DIAGNOSIS — M25519 Pain in unspecified shoulder: Secondary | ICD-10-CM

## 2014-01-20 DIAGNOSIS — M25512 Pain in left shoulder: Secondary | ICD-10-CM

## 2014-01-20 DIAGNOSIS — M7520 Bicipital tendinitis, unspecified shoulder: Secondary | ICD-10-CM

## 2014-01-20 DIAGNOSIS — M19019 Primary osteoarthritis, unspecified shoulder: Secondary | ICD-10-CM | POA: Insufficient documentation

## 2014-01-20 DIAGNOSIS — M7522 Bicipital tendinitis, left shoulder: Secondary | ICD-10-CM

## 2014-01-20 MED ORDER — NITROGLYCERIN 0.2 MG/HR TD PT24: MEDICATED_PATCH | TRANSDERMAL | Status: DC

## 2014-01-20 NOTE — Assessment & Plan Note (Signed)
I believe still the patient's pain is likely still secondary to the biceps subluxation. There is a possibility that this could also be from a.c. joint arthritis. Patient is going to start him a nitroglycerin patches and he was warned the potential side effects. We discussed continuing the icing and compression I think will be beneficial. We discussed changing different workout routines to avoid the stress on this area. Patient will try this and come back again in 3-4 weeks. If continuing to have pain I would like to do an injection within the bicipital tendon sheath.  Spent greater than 25 minutes with patient face-to-face and had greater than 50% of counseling including as described above in assessment and plan.

## 2014-01-20 NOTE — Progress Notes (Signed)
  [name] [place], [place] [address] Phone: [phone] Subjective:     CC: Left shoulder pain  [id number] Carlos Gaines is a 58 y.o. male coming in for followup of left shoulder pain. Patient was seen previously and did have more of a biceps subluxation. Patient was told to get a compression sleeve, home exercise program as well as an icing procedure. Patient states that he is still having what appears to be impingement with anytime he is reaching behind his back or such things as putting a bar behind his head. In any type of extension also seems to give him a sharp pain. Patient has not noticed any significant improvement. Patient denies any pain with regular daily activities though and seems to be only when he is lifting significant weight. Patient would like to stay active.     Past medical history, social, surgical and family history all reviewed in electronic medical record.   Review of Systems: No headache, visual changes, nausea, vomiting, diarrhea, constipation, dizziness, abdominal pain, skin rash, fevers, chills, night sweats, weight loss, swollen lymph nodes, body aches, joint swelling, muscle aches, chest pain, shortness of breath, mood changes.   Objective Blood pressure 130/80, pulse 87, height 5\' 10"  (1.778 m), weight 229 lb (103.874 kg), SpO2 97.00%.  General: No apparent distress alert and oriented x3 mood and affect normal, dressed appropriately.  HEENT: Pupils equal, extraocular movements intact  Respiratory: Patient's speak in full sentences and does not appear short of breath  Cardiovascular: No lower extremity edema, non tender, no erythema  Skin: Warm dry intact with no signs of infection or rash on extremities or on axial skeleton.  Abdomen: Soft nontender  Neuro: Cranial nerves II through XII are intact, neurovascularly intact in all extremities with 2+ DTRs and 2+ pulses.  Lymph: No lymphadenopathy of posterior or  anterior cervical chain or axillae bilaterally.  Gait normal with good balance and coordination.  MSK:  Non tender with full range of motion and good stability and symmetric strength and tone of elbows, wrist, hip, knees bilaterally.  Ankle: Right ankle No visible erythema or swelling. Range of motion is full in all directions. Strength is 5/5 in all directions. Stable lateral and medial ligaments; squeeze test and kleiger test unremarkable; Talar dome nontender; No pain at base of 5th MT; No tenderness over cuboid; No tenderness over N spot or navicular prominence No tenderness on posterior aspects of lateral and medial malleolus No sign of peroneal tendon subluxations  and no tenderness Negative tarsal tunnel tinel's Able to walk 4 steps. Contralateral ankle unremarkable  Shoulder: Left Inspection reveals no abnormalities, atrophy or asymmetry. Palpation is normal with no tenderness over AC joint but mild over bicipital groove. ROM is full in all planes. Rotator cuff strength normal throughout. Mild signs of impingement with positive Neer and Hawkin's tests, empty can sign. Speeds and Yergason's tests mildly positive. No labral pathology noted with negative Obrien's, negative clunk and good stability. Normal scapular function observed. No painful arc and no drop arm sign. No apprehension sign Contralateral shoulder unremarkable    X-rays were ordered reviewed and interpreted by me today. 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    Impression and Recommendations:     This case required medical decision making of moderate complexity.

## 2014-01-20 NOTE — Patient Instructions (Signed)
Very nice to see you.  Continue what you are doing. Continue the compression sleeve and then ice after activity Lets get xrays downstairs today to check that acromion.  Nitroglycerin Protocol   Apply 1/4 nitroglycerin patch to affected area daily.  Change position of patch within the affected area every 24 hours.  You may experience a headache during the first 1-2 weeks of using the patch, these should subside.  If you experience headaches after beginning nitroglycerin patch treatment, you may take your preferred over the counter pain reliever.  Another side effect of the nitroglycerin patch is skin irritation or rash related to patch adhesive.  Please notify our office if you develop more severe headaches or rash, and stop the patch.  Tendon healing with nitroglycerin patch may require 12 to 24 weeks depending on the extent of injury.  Men should not use if taking Viagra, Cialis, or Levitra.   Do not use if you have migraines or rosacea.   Come back in 3-4 weeks at that time if not perfect we will inject either the bicep tendon or the shoulder itself depending on where the pain is.  You can lift but try to keep everything in front as much as possible.

## 2014-01-20 NOTE — Assessment & Plan Note (Signed)
Patient is having some a.c. joint arthritis. Left side seems to be fairly severe. At this time I do not think that this is contributing to his pain but we can always do a intra-articular injection under ultrasound guidance for diagnostic and therapeutic purposes if he does not make any improvement.

## 2014-02-17 ENCOUNTER — Encounter: Payer: Self-pay | Admitting: Family Medicine

## 2014-02-17 ENCOUNTER — Ambulatory Visit (INDEPENDENT_AMBULATORY_CARE_PROVIDER_SITE_OTHER): Payer: 59 | Admitting: Family Medicine

## 2014-02-17 ENCOUNTER — Other Ambulatory Visit (INDEPENDENT_AMBULATORY_CARE_PROVIDER_SITE_OTHER): Payer: 59

## 2014-02-17 VITALS — BP 134/76 | HR 70 | Ht 70.0 in | Wt 229.0 lb

## 2014-02-17 DIAGNOSIS — M25519 Pain in unspecified shoulder: Secondary | ICD-10-CM

## 2014-02-17 DIAGNOSIS — M719 Bursopathy, unspecified: Secondary | ICD-10-CM

## 2014-02-17 DIAGNOSIS — M75102 Unspecified rotator cuff tear or rupture of left shoulder, not specified as traumatic: Secondary | ICD-10-CM | POA: Insufficient documentation

## 2014-02-17 DIAGNOSIS — M25512 Pain in left shoulder: Secondary | ICD-10-CM

## 2014-02-17 DIAGNOSIS — M67919 Unspecified disorder of synovium and tendon, unspecified shoulder: Secondary | ICD-10-CM

## 2014-02-17 NOTE — Assessment & Plan Note (Addendum)
Patient was given an injection.  Patient had near complete resolution of pain immediately after injection. Patient was found to have some more subacromial bursitis as well as what appeared to be a healing posterior labral tear. Patient has  increase his activity and is tolerating a nitroglycerin patch well. Continue with the same therapy at this time and allow patient to advance with more weight lifting and more sports specific training. Patient will continue to do the icing as well. Patient and will come back again in 4 weeks for further evaluation and treatment.  Spent greater than 25 minutes with patient face-to-face and had greater than 50% of counseling including as described above in assessment and plan.

## 2014-02-17 NOTE — Patient Instructions (Signed)
Keep doing exactly what you are doing.  Today take easy.  Tomorrow 50%, next day 75% and then go to town.  Ice is still your friend Continue the nitro for now.  Lets say another 4 weeks.

## 2014-02-17 NOTE — Progress Notes (Signed)
[name] [place], [place] [address] Phone: [phone] Subjective:     CC: Left shoulder pain  [id number] Carlos Gaines is a 58 y.o. male coming in for followup of left shoulder pain. Patient has been able to do a lot more activity. Patient has increased his weight and has been doing more sports specific training. Patient states that overall he continues to improve especially with a nitroglycerin but still has some mild discomfort. Patient is not as strong as he was previously. Patient has had imaging and only showed some a.c. joint arthritis and the bicep subluxation. Patient states that it no longer hurts as much on the bicep it is more on the posterior aspect of the shoulder.     Past medical history, social, surgical and family history all reviewed in electronic medical record.   Review of Systems: No headache, visual changes, nausea, vomiting, diarrhea, constipation, dizziness, abdominal pain, skin rash, fevers, chills, night sweats, weight loss, swollen lymph nodes, body aches, joint swelling, muscle aches, chest pain, shortness of breath, mood changes.   Objective Blood pressure 134/76, pulse 70, height 5\' 10"  (1.778 m), weight 229 lb (103.874 kg), SpO2 95.00%.  General: No apparent distress alert and oriented x3 mood and affect normal, dressed appropriately.  HEENT: Pupils equal, extraocular movements intact  Respiratory: Patient's speak in full sentences and does not appear short of breath  Cardiovascular: No lower extremity edema, non tender, no erythema  Skin: Warm dry intact with no signs of infection or rash on extremities or on axial skeleton.  Abdomen: Soft nontender  Neuro: Cranial nerves II through XII are intact, neurovascularly intact in all extremities with 2+ DTRs and 2+ pulses.  Lymph: No lymphadenopathy of posterior or anterior cervical chain or axillae bilaterally.  Gait normal with good balance and coordination.    MSK:  Non tender with full range of motion and good stability and symmetric strength and tone of elbows, wrist, hip, knees bilaterally.  Ankle: Right ankle No visible erythema or swelling. Range of motion is full in all directions. Strength is 5/5 in all directions. Stable lateral and medial ligaments; squeeze test and kleiger test unremarkable; Talar dome nontender; No pain at base of 5th MT; No tenderness over cuboid; No tenderness over N spot or navicular prominence No tenderness on posterior aspects of lateral and medial malleolus No sign of peroneal tendon subluxations  and no tenderness Negative tarsal tunnel tinel's Able to walk 4 steps. Contralateral ankle unremarkable  Shoulder: Left Inspection reveals no abnormalities, atrophy or asymmetry. Palpation is normal with no tenderness over AC joint but mild over bicipital groove. ROM is full in all planes. Rotator cuff strength normal throughout. Mild signs of impingement with positive Neer and Hawkin's tests, empty can sign. Speeds and Yergason's tests mildly positive. No labral pathology noted with negative Obrien's, negative clunk and good stability. Normal scapular function observed. No painful arc and no drop arm sign. No apprehension sign Contralateral shoulder unremarkable  MSK US performed of: Left shoulder This study was ordered, performed, and interpreted by Charlann Boxer D.O.  Shoulder:   Supraspinatus:  Appears normal on long and transverse views, Mild bursal bulge seen with shoulder abduction on impingement view. Infraspinatus:  Appears normal on long and transverse views. Subscapularis:  Appears normal on long and transverse views. Teres Minor:  Appears normal on long and transverse views. AC joint:  Capsule undistended, no geyser sign. Glenohumeral Joint:  Appears normal without effusion. Glenoid Labrum:  Healed posterior labral tear Biceps Tendon:  Appears normal on long and transverse views, no fraying of  tendon, tendon located in intertubercular groove, no subluxation with shoulder internal or external rotation. No increased power doppler signal. Impression: Subacromial bursitis with healing posterior labral tear    Procedure: Real-time Ultrasound Guided Injection of left glenohumeral joint Device: GE Logiq E  Ultrasound guided injection is preferred based studies that show increased duration, increased effect, greater accuracy, decreased procedural pain, increased response rate with ultrasound guided versus blind injection.  Verbal informed consent obtained.  Time-out conducted.  Noted no overlying erythema, induration, or other signs of local infection.  Skin prepped in a sterile fashion.  Local anesthesia: Topical Ethyl chloride.  With sterile technique and under real time ultrasound guidance:  Joint visualized.  23g 1  inch needle inserted posterior approach. Pictures taken for needle placement. Patient did have injection of 2 cc of 1% lidocaine, 2 cc of 0.5% Marcaine, and 1cc of Kenalog 40 mg/dL. Completed without difficulty  Pain immediately resolved suggesting accurate placement of the medication.  Advised to call if fevers/chills, erythema, induration, drainage, or persistent bleeding.  Images permanently stored and available for review in the ultrasound unit.  Impression: Technically successful ultrasound guided injection.    Impression and Recommendations:     This case required medical decision making of moderate complexity.

## 2014-03-17 ENCOUNTER — Ambulatory Visit (INDEPENDENT_AMBULATORY_CARE_PROVIDER_SITE_OTHER): Payer: 59 | Admitting: Family Medicine

## 2014-03-17 ENCOUNTER — Encounter: Payer: Self-pay | Admitting: Family Medicine

## 2014-03-17 VITALS — BP 130/78 | HR 71 | Ht 70.0 in | Wt 219.0 lb

## 2014-03-17 DIAGNOSIS — M719 Bursopathy, unspecified: Secondary | ICD-10-CM

## 2014-03-17 DIAGNOSIS — M75102 Unspecified rotator cuff tear or rupture of left shoulder, not specified as traumatic: Secondary | ICD-10-CM

## 2014-03-17 DIAGNOSIS — M67919 Unspecified disorder of synovium and tendon, unspecified shoulder: Secondary | ICD-10-CM

## 2014-03-17 NOTE — Progress Notes (Signed)
  [facility] [place], [place] [address] Phone: [phone] Subjective:     CC: Left shoulder pain  [id number] Carlos Gaines is a 58 y.o. male coming in for followup of left shoulder pain. Patient has been able to do a lot more activity. Patient has increased his weight and has been doing more sports specific training.  Patient states that he is 100% at this time. Patient did have an injection at last visit just to get over the edge. Patient is no longer using a nitroglycerin patch a regular basis. Patient has not taken any medications. Patient is sleeping comfortably and is having no pain with any activities of daily living or sports training. Patient is very happy with the results.     Past medical history, social, surgical and family history all reviewed in electronic medical record.   Review of Systems: No headache, visual changes, nausea, vomiting, diarrhea, constipation, dizziness, abdominal pain, skin rash, fevers, chills, night sweats, weight loss, swollen lymph nodes, body aches, joint swelling, muscle aches, chest pain, shortness of breath, mood changes.   Objective Blood pressure 130/78, pulse 71, height 5\' 10"  (1.778 m), weight 219 lb (99.338 kg), SpO2 98.00%.  General: No apparent distress alert and oriented x3 mood and affect normal, dressed appropriately.  HEENT: Pupils equal, extraocular movements intact  Respiratory: Patient's speak in full sentences and does not appear short of breath  Cardiovascular: No lower extremity edema, non tender, no erythema  Skin: Warm dry intact with no signs of infection or rash on extremities or on axial skeleton.  Abdomen: Soft nontender  Neuro: Cranial nerves II through XII are intact, neurovascularly intact in all extremities with 2+ DTRs and 2+ pulses.  Lymph: No lymphadenopathy of posterior or anterior cervical chain or axillae bilaterally.  Gait normal with good balance and coordination.    MSK:  Non tender with full range of motion and good stability and symmetric strength and tone of elbows, wrist, hip, knees and ankles bilaterally.    Shoulder: Left Inspection reveals no abnormalities, atrophy or asymmetry. Palpation is normal with no tenderness over AC joint or bicipital groove. ROM is full in all planes. Rotator cuff strength normal throughout. No signs of impingement with negative Neer and Hawkin's tests, empty can sign. Speeds and Yergason's tests normal. No labral pathology noted with negative Obrien's, negative clunk and good stability. Normal scapular function observed. No painful arc and no drop arm sign. No apprehension sign Contralateral shoulder unremarkable       Impression and Recommendations:     This case required medical decision making of moderate complexity.

## 2014-03-17 NOTE — Assessment & Plan Note (Signed)
The patient is doing markedly well at this time. Patient is able to do all activities as well as increasing his weights with his training with no significant problems. Patient as well as continues to do well can followup on an as-needed basis.

## 2014-03-20 ENCOUNTER — Other Ambulatory Visit: Payer: Self-pay | Admitting: Family Medicine

## 2014-03-20 NOTE — Telephone Encounter (Signed)
Refill done.  

## 2014-10-31 ENCOUNTER — Other Ambulatory Visit: Payer: Self-pay | Admitting: Internal Medicine

## 2014-11-02 NOTE — Telephone Encounter (Signed)
Sent to the pharmacy by e-scribe.  Pt has upcoming CPX on 11/20/14

## 2014-11-13 ENCOUNTER — Other Ambulatory Visit (INDEPENDENT_AMBULATORY_CARE_PROVIDER_SITE_OTHER): Payer: 59

## 2014-11-13 DIAGNOSIS — Z0000 Encounter for general adult medical examination without abnormal findings: Secondary | ICD-10-CM | POA: Diagnosis not present

## 2014-11-13 LAB — CBC WITH DIFFERENTIAL/PLATELET
BASOS ABS: 0 10*3/uL (ref 0.0–0.1)
BASOS PCT: 0.4 % (ref 0.0–3.0)
EOS PCT: 4 % (ref 0.0–5.0)
Eosinophils Absolute: 0.2 10*3/uL (ref 0.0–0.7)
HEMATOCRIT: 44.7 % (ref 39.0–52.0)
HEMOGLOBIN: 15.3 g/dL (ref 13.0–17.0)
LYMPHS ABS: 1.4 10*3/uL (ref 0.7–4.0)
LYMPHS PCT: 28 % (ref 12.0–46.0)
MCHC: 34.3 g/dL (ref 30.0–36.0)
MCV: 90.4 fl (ref 78.0–100.0)
Monocytes Absolute: 0.7 10*3/uL (ref 0.1–1.0)
Monocytes Relative: 13.9 % — ABNORMAL HIGH (ref 3.0–12.0)
NEUTROS ABS: 2.6 10*3/uL (ref 1.4–7.7)
Neutrophils Relative %: 53.7 % (ref 43.0–77.0)
Platelets: 219 10*3/uL (ref 150.0–400.0)
RBC: 4.94 Mil/uL (ref 4.22–5.81)
RDW: 13.2 % (ref 11.5–15.5)
WBC: 4.9 10*3/uL (ref 4.0–10.5)

## 2014-11-13 LAB — BASIC METABOLIC PANEL
BUN: 18 mg/dL (ref 6–23)
CALCIUM: 9.5 mg/dL (ref 8.4–10.5)
CO2: 30 mEq/L (ref 19–32)
CREATININE: 1.05 mg/dL (ref 0.40–1.50)
Chloride: 104 mEq/L (ref 96–112)
GFR: 77.07 mL/min (ref >60.00)
GLUCOSE: 95 mg/dL (ref 70–99)
Potassium: 4.7 mEq/L (ref 3.5–5.1)
SODIUM: 140 meq/L (ref 135–145)

## 2014-11-13 LAB — HEPATIC FUNCTION PANEL
ALBUMIN: 4.3 g/dL (ref 3.5–5.2)
ALK PHOS: 66 U/L (ref 39–117)
ALT: 19 U/L (ref 0–53)
AST: 20 U/L (ref 0–37)
BILIRUBIN TOTAL: 0.7 mg/dL (ref 0.2–1.2)
Bilirubin, Direct: 0.1 mg/dL (ref 0.0–0.3)
Total Protein: 7.2 g/dL (ref 6.0–8.3)

## 2014-11-13 LAB — LIPID PANEL
CHOL/HDL RATIO: 4
Cholesterol: 149 mg/dL (ref 0–200)
HDL: 36 mg/dL — ABNORMAL LOW (ref >39.00)
LDL CALC: 82 mg/dL (ref 0–99)
NonHDL: 113
Triglycerides: 157 mg/dL — ABNORMAL HIGH (ref 0.0–149.0)
VLDL: 31.4 mg/dL (ref 0.0–40.0)

## 2014-11-13 LAB — TSH: TSH: 3.01 u[IU]/mL (ref 0.35–4.50)

## 2014-11-13 LAB — PSA: PSA: 0.26 ng/mL (ref 0.10–4.00)

## 2014-11-20 ENCOUNTER — Ambulatory Visit (INDEPENDENT_AMBULATORY_CARE_PROVIDER_SITE_OTHER): Payer: 59 | Admitting: Internal Medicine

## 2014-11-20 ENCOUNTER — Encounter: Payer: Self-pay | Admitting: Internal Medicine

## 2014-11-20 VITALS — BP 136/80 | Temp 98.3°F | Ht 70.0 in | Wt 218.1 lb

## 2014-11-20 DIAGNOSIS — E786 Lipoprotein deficiency: Secondary | ICD-10-CM

## 2014-11-20 DIAGNOSIS — R030 Elevated blood-pressure reading, without diagnosis of hypertension: Secondary | ICD-10-CM

## 2014-11-20 DIAGNOSIS — Z0000 Encounter for general adult medical examination without abnormal findings: Secondary | ICD-10-CM

## 2014-11-20 DIAGNOSIS — R509 Fever, unspecified: Secondary | ICD-10-CM | POA: Diagnosis not present

## 2014-11-20 DIAGNOSIS — E785 Hyperlipidemia, unspecified: Secondary | ICD-10-CM | POA: Diagnosis not present

## 2014-11-20 DIAGNOSIS — Z136 Encounter for screening for cardiovascular disorders: Secondary | ICD-10-CM

## 2014-11-20 NOTE — Patient Instructions (Addendum)
Continue lifestyle intervention healthy eating and exercise .   Wt Readings from Last 3 Encounters:  11/20/14 218 lb 1.6 oz (98.93 kg)  03/17/14 219 lb (99.338 kg)  02/17/14 229 lb (103.874 kg)   Healthy lifestyle includes : At least 150 minutes of exercise weeks  , weight at healthy levels, which is usually   BMI 19-25. Avoid trans fats and processed foods;  Increase fresh fruits and veges to 5 servings per day. And avoid sweet beverages including tea and juice. Mediterranean diet with olive oil and nuts have been noted to be heart and brain healthy . Avoid tobacco products . Limit  alcohol to  7 per week for women and 14 servings for men.  Get adequate sleep . Wear seat belts . Don't text and drive .   Cut out the sweet the sweet tea.   Continue  limited use of omeprazole  . Contact us if persistent fever severe pain rash etc. Probably a virla illness self limited       Why follow it? Research shows. . Those who follow the Mediterranean diet have a reduced risk of heart disease  . The diet is associated with a reduced incidence of Parkinson's and Alzheimer's diseases . People following the diet may have longer life expectancies and lower rates of chronic diseases  . The Dietary Guidelines for Americans recommends the Mediterranean diet as an eating plan to promote health and prevent disease  What Is the Mediterranean Diet?  . Healthy eating plan based on typical foods and recipes of Mediterranean-style cooking . The diet is primarily a plant based diet; these foods should make up a majority of meals   Starches - Plant based foods should make up a majority of meals - They are an important sources of vitamins, minerals, energy, antioxidants, and fiber - Choose whole grains, foods high in fiber and minimally processed items  - Typical grain sources include wheat, oats, barley, corn, brown rice, bulgar, farro, millet, polenta, couscous  - Various types of beans include  chickpeas, lentils, fava beans, black beans, white beans   Fruits  Veggies - Large quantities of antioxidant rich fruits & veggies; 6 or more servings  - Vegetables can be eaten raw or lightly drizzled with oil and cooked  - Vegetables common to the traditional Mediterranean Diet include: artichokes, arugula, beets, broccoli, brussel sprouts, cabbage, carrots, celery, collard greens, cucumbers, eggplant, kale, leeks, lemons, lettuce, mushrooms, okra, onions, peas, peppers, potatoes, pumpkin, radishes, rutabaga, shallots, spinach, sweet potatoes, turnips, zucchini - Fruits common to the Mediterranean Diet include: apples, apricots, avocados, cherries, clementines, dates, figs, grapefruits, grapes, melons, nectarines, oranges, peaches, pears, pomegranates, strawberries, tangerines  Fats - Replace butter and margarine with healthy oils, such as olive oil, canola oil, and tahini  - Limit nuts to no more than a handful a day  - Nuts include walnuts, almonds, pecans, pistachios, pine nuts  - Limit or avoid candied, honey roasted or heavily salted nuts - Olives are central to the [employer] - can be eaten whole or used in a variety of dishes   Meats Protein - Limiting red meat: no more than a few times a month - When eating red meat: choose lean cuts and keep the portion to the size of deck of cards - Eggs: approx. 0 to 4 times a week  - Fish and lean poultry: at least 2 a week  - Healthy protein sources include, chicken, [place], lean beef, lamb - Increase intake of  seafood such as tuna, salmon, trout, mackerel, shrimp, scallops - Avoid or limit high fat processed meats such as sausage and bacon  Dairy - Include moderate amounts of low fat dairy products  - Focus on healthy dairy such as fat free yogurt, skim milk, low or reduced fat cheese - Limit dairy products higher in fat such as whole or 2% milk, cheese, ice cream  Alcohol - Moderate amounts of red wine is ok  - No more than 5 oz daily  for women (all ages) and men older than age 35  - No more than 10 oz of wine daily for men younger than 43  Other - Limit sweets and other desserts  - Use herbs and spices instead of salt to flavor foods  - Herbs and spices common to the traditional Mediterranean Diet include: basil, bay leaves, chives, cloves, cumin, fennel, garlic, lavender, marjoram, mint, oregano, parsley, pepper, rosemary, sage, savory, sumac, tarragon, thyme   It's not just a diet, it's a lifestyle:  . The Mediterranean diet includes lifestyle factors typical of those in the region  . Foods, drinks and meals are best eaten with others and savored . Daily physical activity is important for overall good health . This could be strenuous exercise like running and aerobics . This could also be more leisurely activities such as walking, housework, yard-work, or taking the stairs . Moderation is the key; a balanced and healthy diet accommodates most foods and drinks . Consider portion sizes and frequency of consumption of certain foods   Meal Ideas & Options:  . Breakfast:  o Whole wheat toast or whole wheat English muffins with peanut butter & hard boiled egg o Steel cut oats topped with apples & cinnamon and skim milk  o Fresh fruit: banana, strawberries, melon, berries, peaches  o Smoothies: strawberries, bananas, greek yogurt, peanut butter o Low fat greek yogurt with blueberries and granola  o Egg white omelet with spinach and mushrooms o Breakfast couscous: whole wheat couscous, apricots, skim milk, cranberries  . Sandwiches:  o Hummus and grilled vegetables (peppers, zucchini, squash) on whole wheat bread   o Grilled chicken on whole wheat pita with lettuce, tomatoes, cucumbers or tzatziki  o Tuna salad on whole wheat bread: tuna salad made with greek yogurt, olives, red peppers, capers, green onions o Garlic rosemary lamb pita: lamb sauted with garlic, rosemary, salt & pepper; add lettuce, cucumber, greek yogurt  to pita - flavor with lemon juice and black pepper  . Seafood:  o Mediterranean grilled salmon, seasoned with garlic, basil, parsley, lemon juice and black pepper o Shrimp, lemon, and spinach whole-grain pasta salad made with low fat greek yogurt  o Seared scallops with lemon orzo  o Seared tuna steaks seasoned salt, pepper, coriander topped with tomato mixture of olives, tomatoes, olive oil, minced garlic, parsley, green onions and cappers  . Meats:  o Herbed greek chicken salad with kalamata olives, cucumber, feta  o Red bell peppers stuffed with spinach, bulgur, lean ground beef (or lentils) & topped with feta   o Kebabs: skewers of chicken, tomatoes, onions, zucchini, squash  o [place] burgers: made with red onions, mint, dill, lemon juice, feta cheese topped with roasted red peppers . Vegetarian o Cucumber salad: cucumbers, artichoke hearts, celery, red onion, feta cheese, tossed in olive oil & lemon juice  o Hummus and whole grain pita points with a greek salad (lettuce, tomato, feta, olives, cucumbers, red onion) o Lentil soup with celery, carrots made with  vegetable broth, garlic, salt and pepper  o Tabouli salad: parsley, bulgur, mint, scallions, cucumbers, tomato, radishes, lemon juice, olive oil, salt and pepper.

## 2014-11-20 NOTE — Progress Notes (Signed)
Pre visit review using our clinic review tool, if applicable. No additional management support is needed unless otherwise documented below in the visit note.  Chief Complaint  Patient presents with  . Annual Exam    HPI: Patient  Carlos Gaines  59 y.o. comes in today for Preventive Health Care visit  Lipids  Taking simva   275  admist to discovering shrimp and grits and sweet tea.  gerd taking every other days or so .   Keeping weight down as best tolerated  Had fever  this am and abd cramps  Poss flu virus  Wife and gd had fever illness    Health Maintenance  Topic Date Due  . HIV Screening  11/01/2015 (Originally 09/22/1970)  . INFLUENZA VACCINE  03/01/2015  . COLONOSCOPY  07/31/2016  . TETANUS/TDAP  11/18/2023   Health Maintenance Review LIFESTYLE:  Exercise:   Injuries under control to do shot put and walking . Tobacco/ETS:no Alcohol:  4- 5 per week.  Sugar beverages:  Sweet tea.    Some .  Sleep: about 7  Drug use: no  ROS:  GEN/ HEENT: see hpi  significant weight changes sweats headaches vision problems hearing changes, CV/ PULM; No chest pain shortness of breath cough, syncope,edema  change in exercise tolerance. GI /GU: No adominal pain, vomiting, change in bowel habits. No blood in the stool. No significant GU symptoms. SKIN/HEME: ,no acute skin rashes suspicious lesions or bleeding. No lymphadenopathy, nodules, masses.  NEURO/ PSYCH:  No neurologic signs such as weakness numbness. No depression anxiety. IMM/ Allergy: No unusual infections.  Allergy .   REST of 12 system review negative except as per HPI   Past Medical History  Diagnosis Date  . GERD (gastroesophageal reflux disease)     endo neg x 2? on  chornnic ppi  . Hyperlipidemia     orig  270 ranges  on meds for 10 years   . Asthma     hx allergy shots in [place]   . Chicken pox   . Heart murmur     cardiac cath neg for sob   . Hypertension     white coat   . UTI (lower urinary tract infection)     x  1 with fever   . H/O cardiac catheterization     neg wu for sob  in ed     Past Surgical History  Procedure Laterality Date  . Shoulder repair      2009  . Tonsillectomy      1963  . Cardiac catheterization      Family History  Problem Relation Age of Onset  . Arthritis Father   . Hyperlipidemia Father   . Aneurysm Father   . Graves' disease Sister     History   Social History  . Marital Status: Married    Spouse Name: N/A  . Number of Children: N/A  . Years of Education: N/A   Social History Main Topics  . Smoking status: Never Smoker   . Smokeless tobacco: Never Used  . Alcohol Use: Yes  . Drug Use: Not on file  . Sexual Activity: Not on file   Other Topics Concern  . None   Social History Narrative   7 hours of sleep per night   hh of 2    Patient lives with his wife.   [name]:  Moved from [place].    Job Conservation officer, historic buildings ;. 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   Brother good health    Used to do weigh lifting now does som shot put     Outpatient Encounter Prescriptions as of 11/20/2014  Medication Sig  . albuterol (PROVENTIL HFA;VENTOLIN HFA) 108 (90 BASE) MCG/ACT inhaler Inhale 2 puffs into the lungs every 6 (six) hours as needed for wheezing or shortness of breath. Generic albuterol HFA please  . omeprazole (PRILOSEC) 20 MG capsule Take 1 capsule by mouth  daily  . simvastatin (ZOCOR) 40 MG tablet Take 1 tablet by mouth  every evening  . [DISCONTINUED] meloxicam (MOBIC) 15 MG tablet Take 1 tablet (15 mg total) by mouth daily.  . [DISCONTINUED] nitroGLYCERIN (NITRODUR - DOSED IN MG/24 HR) 0.2 mg/hr patch APPLY 1/4 PATCH DAILY    EXAM:  BP 136/80 mmHg  Temp(Src) 98.3 F (36.8 C)  Ht 5\' 10"  (1.778 m)  Wt 218 lb 1.6 oz (98.93 kg)  BMI 31.29 kg/m2  Body mass index is 31.29 kg/(m^2).  Physical Exam: Vital signs reviewed ASN:KNLZ is a well-developed well-nourished alert cooperative    who appearsr stated age in no  acute distress.  HEENT: normocephalic atraumatic , Eyes: PERRL EOM's full, conjunctiva clear, Nares: paten,t no deformity discharge or tenderness., Ears: no deformity EAC's clear TMs with normal landmarks. Mouth: clear OP, no lesions, edema.  Moist mucous membranes. Dentition in adequate repair. NECK: supple without masses, thyromegaly or bruits. CHEST/PULM:  Clear to auscultation and percussion breath sounds equal no wheeze , rales or rhonchi. No chest wall deformities or tenderness. CV: PMI is nondisplaced, S1 S2 no gallops, murmurs, rubs. Peripheral pulses are full without delay.No JVD .  ABDOMEN: Bowel sounds normal minimal tenderness lower abd  No guard or rebound, no hepato splenomegal no CVA tenderness.  No hernia. Extremtities:  No clubbing cyanosis or edema, no acute joint swelling or redness no focal atrophy NEURO:  Oriented x3, cranial nerves 3-12 appear to be intact, no obvious focal weakness,gait within normal limits no abnormal reflexes or asymmetrical SKIN: No acute rashes normal turgor, color, no bruising or petechiae. PSYCH: Oriented, good eye contact, no obvious depression anxiety, cognition and judgment appear normal. LN: no cervical axillary inguinal adenopathy  Lab Results  Component Value Date   WBC 4.9 11/13/2014   HGB 15.3 11/13/2014   HCT 44.7 11/13/2014   PLT 219.0 11/13/2014   GLUCOSE 95 11/13/2014   CHOL 149 11/13/2014   TRIG 157.0* 11/13/2014   HDL 36.00* 11/13/2014   LDLCALC 82 11/13/2014   ALT 19 11/13/2014   AST 20 11/13/2014   NA 140 11/13/2014   K 4.7 11/13/2014   CL 104 11/13/2014   CREATININE 1.05 11/13/2014   BUN 18 11/13/2014   CO2 30 11/13/2014   TSH 3.01 11/13/2014   PSA 0.26 11/13/2014   BP Readings from Last 3 Encounters:  11/20/14 136/80  03/17/14 130/78  02/17/14 134/76   EKG nsr no acute findings  Baseline  ASSESSMENT AND PLAN:  Discussed the following assessment and plan:  Visit for preventive health examination - Plan: EKG  12-Lead  Low HDL (under 40) - counseled - Plan: EKG 12-Lead  Hyperlipidemia - Plan: EKG 12-Lead  Screening for cardiovascular condition - ekg nl   Other specified fever - prob early viral illness  expectant managment   Elevated blood pressure reading - better today since  losing weight   Patient Care Team: Burnis Medin, MD as PCP - General (Internal Medicine) Hillery Jacks, MD as Referring Physician (Dermatology)  Patient Instructions     Continue lifestyle intervention healthy eating and exercise .   Wt Readings from Last 3 Encounters:  11/20/14 218 lb 1.6 oz (98.93 kg)  03/17/14 219 lb (99.338 kg)  02/17/14 229 lb (103.874 kg)   Healthy lifestyle includes : At least 150 minutes of exercise weeks  , weight at healthy levels, which is usually   BMI 19-25. Avoid trans fats and processed foods;  Increase fresh fruits and veges to 5 servings per day. And avoid sweet beverages including tea and juice. Mediterranean diet with olive oil and nuts have been noted to be heart and brain healthy . Avoid tobacco products . Limit  alcohol to  7 per week for women and 14 servings for men.  Get adequate sleep . Wear seat belts . Don't text and drive .   Cut out the sweet the sweet tea.   Continue  limited use of omeprazole  . Contact us if persistent fever severe pain rash etc. Probably a virla illness self limited       Why follow it? Research shows. . Those who follow the Mediterranean diet have a reduced risk of heart disease  . The diet is associated with a reduced incidence of Parkinson's and Alzheimer's diseases . People following the diet may have longer life expectancies and lower rates of chronic diseases  . The Dietary Guidelines for Americans recommends the Mediterranean diet as an eating plan to promote health and prevent disease  What Is the Mediterranean Diet?  . Healthy eating plan based on typical foods and recipes of Mediterranean-style cooking . The diet is  primarily a plant based diet; these foods should make up a majority of meals   Starches - Plant based foods should make up a majority of meals - They are an important sources of vitamins, minerals, energy, antioxidants, and fiber - Choose whole grains, foods high in fiber and minimally processed items  - Typical grain sources include wheat, oats, barley, corn, brown rice, bulgar, farro, millet, polenta, couscous  - Various types of beans include chickpeas, lentils, fava beans, black beans, white beans   Fruits  Veggies - Large quantities of antioxidant rich fruits & veggies; 6 or more servings  - Vegetables can be eaten raw or lightly drizzled with oil and cooked  - Vegetables common to the traditional Mediterranean Diet include: artichokes, arugula, beets, broccoli, brussel sprouts, cabbage, carrots, celery, collard greens, cucumbers, eggplant, kale, leeks, lemons, lettuce, mushrooms, okra, onions, peas, peppers, potatoes, pumpkin, radishes, rutabaga, shallots, spinach, sweet potatoes, turnips, zucchini - Fruits common to the Mediterranean Diet include: apples, apricots, avocados, cherries, clementines, dates, figs, grapefruits, grapes, melons, nectarines, oranges, peaches, pears, pomegranates, strawberries, tangerines  Fats - Replace butter and margarine with healthy oils, such as olive oil, canola oil, and tahini  - Limit nuts to no more than a handful a day  - Nuts include walnuts, almonds, pecans, pistachios, pine nuts  - Limit or avoid candied, honey roasted or heavily salted nuts - Olives are central to the [employer] - can be eaten whole or used in a variety of dishes   Meats Protein - Limiting red meat: no more than a few times a month - When eating red meat: choose lean cuts and keep the portion to the size of deck of cards - Eggs: approx. 0 to 4 times a week  - Fish and lean poultry: at least 2 a week  - Healthy protein sources include, chicken, [place], lean beef,  lamb -  Increase intake of seafood such as tuna, salmon, trout, mackerel, shrimp, scallops - Avoid or limit high fat processed meats such as sausage and bacon  Dairy - Include moderate amounts of low fat dairy products  - Focus on healthy dairy such as fat free yogurt, skim milk, low or reduced fat cheese - Limit dairy products higher in fat such as whole or 2% milk, cheese, ice cream  Alcohol - Moderate amounts of red wine is ok  - No more than 5 oz daily for women (all ages) and men older than age 37  - No more than 10 oz of wine daily for men younger than 36  Other - Limit sweets and other desserts  - Use herbs and spices instead of salt to flavor foods  - Herbs and spices common to the traditional Mediterranean Diet include: basil, bay leaves, chives, cloves, cumin, fennel, garlic, lavender, marjoram, mint, oregano, parsley, pepper, rosemary, sage, savory, sumac, tarragon, thyme   It's not just a diet, it's a lifestyle:  . The Mediterranean diet includes lifestyle factors typical of those in the region  . Foods, drinks and meals are best eaten with others and savored . Daily physical activity is important for overall good health . This could be strenuous exercise like running and aerobics . This could also be more leisurely activities such as walking, housework, yard-work, or taking the stairs . Moderation is the key; a balanced and healthy diet accommodates most foods and drinks . Consider portion sizes and frequency of consumption of certain foods   Meal Ideas & Options:  . Breakfast:  o Whole wheat toast or whole wheat English muffins with peanut butter & hard boiled egg o Steel cut oats topped with apples & cinnamon and skim milk  o Fresh fruit: banana, strawberries, melon, berries, peaches  o Smoothies: strawberries, bananas, greek yogurt, peanut butter o Low fat greek yogurt with blueberries and granola  o Egg white omelet with spinach and mushrooms o Breakfast couscous: whole wheat  couscous, apricots, skim milk, cranberries  . Sandwiches:  o Hummus and grilled vegetables (peppers, zucchini, squash) on whole wheat bread   o Grilled chicken on whole wheat pita with lettuce, tomatoes, cucumbers or tzatziki  o Tuna salad on whole wheat bread: tuna salad made with greek yogurt, olives, red peppers, capers, green onions o Garlic rosemary lamb pita: lamb sauted with garlic, rosemary, salt & pepper; add lettuce, cucumber, greek yogurt to pita - flavor with lemon juice and black pepper  . Seafood:  o Mediterranean grilled salmon, seasoned with garlic, basil, parsley, lemon juice and black pepper o Shrimp, lemon, and spinach whole-grain pasta salad made with low fat greek yogurt  o Seared scallops with lemon orzo  o Seared tuna steaks seasoned salt, pepper, coriander topped with tomato mixture of olives, tomatoes, olive oil, minced garlic, parsley, green onions and cappers  . Meats:  o Herbed greek chicken salad with kalamata olives, cucumber, feta  o Red bell peppers stuffed with spinach, bulgur, lean ground beef (or lentils) & topped with feta   o Kebabs: skewers of chicken, tomatoes, onions, zucchini, squash  o [place] burgers: made with red onions, mint, dill, lemon juice, feta cheese topped with roasted red peppers . Vegetarian o Cucumber salad: cucumbers, artichoke hearts, celery, red onion, feta cheese, tossed in olive oil & lemon juice  o Hummus and whole grain pita points with a greek salad (lettuce, tomato, feta, olives, cucumbers, red onion) o Lentil soup  with celery, carrots made with vegetable broth, garlic, salt and pepper  o Tabouli salad: parsley, bulgur, mint, scallions, cucumbers, tomato, radishes, lemon juice, olive oil, salt and pepper.         Standley Brooking. Ajia Chadderdon M.D.

## 2015-01-04 ENCOUNTER — Encounter: Payer: Self-pay | Admitting: Internal Medicine

## 2015-01-04 ENCOUNTER — Ambulatory Visit (INDEPENDENT_AMBULATORY_CARE_PROVIDER_SITE_OTHER): Payer: 59 | Admitting: Internal Medicine

## 2015-01-04 VITALS — BP 150/94 | Temp 98.4°F | Ht 70.0 in | Wt 224.9 lb

## 2015-01-04 DIAGNOSIS — R030 Elevated blood-pressure reading, without diagnosis of hypertension: Secondary | ICD-10-CM | POA: Diagnosis not present

## 2015-01-04 DIAGNOSIS — R300 Dysuria: Secondary | ICD-10-CM

## 2015-01-04 DIAGNOSIS — N209 Urinary calculus, unspecified: Secondary | ICD-10-CM

## 2015-01-04 DIAGNOSIS — IMO0001 Reserved for inherently not codable concepts without codable children: Secondary | ICD-10-CM

## 2015-01-04 LAB — POCT URINALYSIS DIPSTICK
Bilirubin, UA: NEGATIVE
Blood, UA: NEGATIVE
Glucose, UA: NEGATIVE
Ketones, UA: NEGATIVE
Leukocytes, UA: NEGATIVE
Nitrite, UA: NEGATIVE
Protein, UA: NEGATIVE
Spec Grav, UA: 1.02
Urobilinogen, UA: 0.2
pH, UA: 5.5

## 2015-01-04 NOTE — Progress Notes (Signed)
Pre visit review using our clinic review tool, if applicable. No additional management support is needed unless otherwise documented below in the visit note.  Chief Complaint  Patient presents with  . Painful Urination    HPI: Patient Carlos Gaines  comes in today for SDA for  new problem evaluation.  Was in his usual state of health until yesterday he had episodes of severe pain with urination. No hematuria is noted noticed a piece coming out of the urethra and grabbed the next gravelly pieces and put in a bag brings it in today. Since passing of the last one his pain is gone. No flank pain or fever no history of kidney stones however he does tend to ache. 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   BP120 /82  At home   No personal history of renal stones question of family history father had gout  ROS: See pertinent positives and negatives per HPI.remote hs of uti allsx stopped after passing   Past Medical History  Diagnosis Date  . GERD (gastroesophageal reflux disease)     endo neg x 2? on  chornnic ppi  . Hyperlipidemia     orig  270 ranges  on meds for 10 years   . Asthma     hx allergy shots in [place]   . Chicken pox   . Heart murmur     cardiac cath neg for sob   . Hypertension     white coat   . UTI (lower urinary tract infection)     x 1 with fever   . H/O cardiac catheterization     neg wu for sob  in ed     Family History  Problem Relation Age of Onset  . Arthritis Father   . Hyperlipidemia Father   . Aneurysm Father   . Graves' disease Sister     History   Social History  . Marital Status: Married    Spouse Name: N/A  . Number of Children: N/A  . Years of Education: N/A   Social History Main Topics  . Smoking status: Never Smoker   . Smokeless tobacco: Never Used  . Alcohol Use: Yes  . Drug Use: Not on file  . Sexual Activity: Not on file   Other Topics Concern  . None   Social  History Narrative   7 hours of sleep per night   hh of 2    Patient lives with his wife.   [name]:  Moved from [place].    Job Conservation officer, historic buildings ;. 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   Brother good health    Used to do weigh lifting now does som shot put     Outpatient Prescriptions Prior to Visit  Medication Sig Dispense Refill  . omeprazole (PRILOSEC) 20 MG capsule Take 1 capsule by mouth  daily 90 capsule 0  . simvastatin (ZOCOR) 40 MG tablet Take 1 tablet by mouth  every evening 90 tablet 0  . albuterol (PROVENTIL HFA;VENTOLIN HFA) 108 (90 BASE) MCG/ACT inhaler Inhale 2 puffs into the lungs every 6 (six) hours as needed for wheezing or shortness of breath. Generic albuterol HFA please (Patient not taking: Reported on 01/04/2015) 1 Inhaler 2   No facility-administered medications prior to visit.     EXAM:  BP 150/94 mmHg  Temp(Src) 98.4 F (36.9  C) (Oral)  Ht 5\' 10"  (1.778 m)  Wt 224 lb 14.4 oz (102.014 kg)  BMI 32.27 kg/m2  Body mass index is 32.27 kg/(m^2).  GENERAL: vitals reviewed and listed above, alert, oriented, appears well hydrated and in no acute distress HEENT: atraumatic, conjunctiva  clear, no obvious abnormalities on inspection of external nose and ears NECK: no obvious masses on inspection palpation  Lt CV: HRRR, no clubbing cyanosis or  peripheral edema MS: moves all extremities without noticeable focal  abnormality PSYCH: pleasant and cooperative, no obvious depression or anxiety Lab Results  Component Value Date   WBC 4.9 11/13/2014   HGB 15.3 11/13/2014   HCT 44.7 11/13/2014   PLT 219.0 11/13/2014   GLUCOSE 95 11/13/2014   CHOL 149 11/13/2014   TRIG 157.0* 11/13/2014   HDL 36.00* 11/13/2014   LDLCALC 82 11/13/2014   ALT 19 11/13/2014   AST 20 11/13/2014   NA 140 11/13/2014   K 4.7 11/13/2014   CL 104 11/13/2014   CREATININE 1.05 11/13/2014   BUN 18 11/13/2014   CO2 30 11/13/2014   TSH 3.01 11/13/2014   PSA  0.26 11/13/2014   urinalysis is clear has a baggy with 2 small 1.5 mm irregular pink gravel.  ASSESSMENT AND PLAN:  Discussed the following assessment and plan:  Dysuria - Plan: POC Urinalysis Dipstick  Urinary tract stones - Plan: Stone analysis  White coat hypertension - rerported nl at home  Specimen sent for stone analysis  -Patient advised to return or notify health care team  if symptoms worsen ,persist or new concerns arise.  Patient Instructions  This acts like kidney stone or gravel.   Will send this for  Pathology stone evaluation .   Plan  urology  Consults  Will probably need some type of imaging study to review your urinary tract.  Contact us  If recurrent .   stay hydrated     Standley Brooking. Panosh M.D.

## 2015-01-04 NOTE — Patient Instructions (Addendum)
This acts like kidney stone or gravel.   Will send this for  Pathology stone evaluation .   Plan  urology  Consults  Will probably need some type of imaging study to review your urinary tract.  Contact us  If recurrent .   stay hydrated

## 2015-01-08 LAB — STONE ANALYSIS: Stone Weight KSTONE: 0.001 g

## 2015-01-14 ENCOUNTER — Encounter: Payer: Self-pay | Admitting: Internal Medicine

## 2015-01-14 DIAGNOSIS — N209 Urinary calculus, unspecified: Secondary | ICD-10-CM | POA: Insufficient documentation

## 2015-01-14 HISTORY — DX: Urinary calculus, unspecified: N20.9

## 2015-01-29 ENCOUNTER — Other Ambulatory Visit: Payer: Self-pay | Admitting: Internal Medicine

## 2015-01-29 NOTE — Telephone Encounter (Signed)
Sent to the pharmacy by e-scribe. 

## 2015-11-15 ENCOUNTER — Other Ambulatory Visit: Payer: Self-pay | Admitting: Internal Medicine

## 2015-11-15 ENCOUNTER — Other Ambulatory Visit (INDEPENDENT_AMBULATORY_CARE_PROVIDER_SITE_OTHER): Payer: BLUE CROSS/BLUE SHIELD

## 2015-11-15 DIAGNOSIS — R7989 Other specified abnormal findings of blood chemistry: Secondary | ICD-10-CM | POA: Diagnosis not present

## 2015-11-15 DIAGNOSIS — Z0000 Encounter for general adult medical examination without abnormal findings: Secondary | ICD-10-CM | POA: Diagnosis not present

## 2015-11-15 LAB — CBC WITH DIFFERENTIAL/PLATELET
BASOS ABS: 0 10*3/uL (ref 0.0–0.1)
Basophils Relative: 0.2 % (ref 0.0–3.0)
EOS PCT: 1.3 % (ref 0.0–5.0)
Eosinophils Absolute: 0.1 10*3/uL (ref 0.0–0.7)
HCT: 43.9 % (ref 39.0–52.0)
Hemoglobin: 14.9 g/dL (ref 13.0–17.0)
LYMPHS ABS: 1.6 10*3/uL (ref 0.7–4.0)
Lymphocytes Relative: 21.8 % (ref 12.0–46.0)
MCHC: 33.8 g/dL (ref 30.0–36.0)
MCV: 91.6 fl (ref 78.0–100.0)
MONO ABS: 0.7 10*3/uL (ref 0.1–1.0)
MONOS PCT: 9.9 % (ref 3.0–12.0)
NEUTROS ABS: 5 10*3/uL (ref 1.4–7.7)
NEUTROS PCT: 66.8 % (ref 43.0–77.0)
PLATELETS: 271 10*3/uL (ref 150.0–400.0)
RBC: 4.8 Mil/uL (ref 4.22–5.81)
RDW: 12.8 % (ref 11.5–15.5)
WBC: 7.4 10*3/uL (ref 4.0–10.5)

## 2015-11-15 LAB — BASIC METABOLIC PANEL
BUN: 20 mg/dL (ref 6–23)
CALCIUM: 9.5 mg/dL (ref 8.4–10.5)
CO2: 26 meq/L (ref 19–32)
Chloride: 105 mEq/L (ref 96–112)
Creatinine, Ser: 1.07 mg/dL (ref 0.40–1.50)
GFR: 75.15 mL/min (ref >60.00)
GLUCOSE: 112 mg/dL — AB (ref 70–99)
Potassium: 4.4 mEq/L (ref 3.5–5.1)
SODIUM: 140 meq/L (ref 135–145)

## 2015-11-15 LAB — LIPID PANEL
CHOLESTEROL: 183 mg/dL (ref 0–200)
HDL: 38.4 mg/dL — ABNORMAL LOW (ref >39.00)
NonHDL: 144.35
Total CHOL/HDL Ratio: 5
Triglycerides: 201 mg/dL — ABNORMAL HIGH (ref 0.0–149.0)
VLDL: 40.2 mg/dL — ABNORMAL HIGH (ref 0.0–40.0)

## 2015-11-15 LAB — TSH: TSH: 2.96 u[IU]/mL (ref 0.35–4.50)

## 2015-11-15 LAB — HEPATIC FUNCTION PANEL
ALBUMIN: 4.3 g/dL (ref 3.5–5.2)
ALT: 25 U/L (ref 0–53)
AST: 25 U/L (ref 0–37)
Alkaline Phosphatase: 54 U/L (ref 39–117)
BILIRUBIN TOTAL: 0.8 mg/dL (ref 0.2–1.2)
Bilirubin, Direct: 0.1 mg/dL (ref 0.0–0.3)
Total Protein: 7.1 g/dL (ref 6.0–8.3)

## 2015-11-15 LAB — PSA: PSA: 0.32 ng/mL (ref 0.10–4.00)

## 2015-11-15 LAB — LDL CHOLESTEROL, DIRECT: Direct LDL: 106 mg/dL

## 2015-11-17 NOTE — Telephone Encounter (Signed)
Sent to the pharmacy by e-scribe for 90 days.  Pt has upcoming cpx on 11/22/15

## 2015-11-19 NOTE — Patient Instructions (Addendum)
Avoid  Sweet tea sugars  Processed carbs   .Marland Kitchen  As you plan Mediterranean diet is the healthiest Stay on same meds . Labs in 4-6 months and then OV to assess .  Wt Readings from Last 3 Encounters:  11/22/15 229 lb (103.874 kg)  01/04/15 224 lb 14.4 oz (102.014 kg)  11/20/14 218 lb 1.6 oz (98.93 kg)       Health Maintenance, Male A healthy lifestyle and preventative care can promote health and wellness.  Maintain regular health, dental, and eye exams.  Eat a healthy diet. Foods like vegetables, fruits, whole grains, low-fat dairy products, and lean protein foods contain the nutrients you need and are low in calories. Decrease your intake of foods high in solid fats, added sugars, and salt. Get information about a proper diet from your health care provider, if necessary.  Regular physical exercise is one of the most important things you can do for your health. Most adults should get at least 150 minutes of moderate-intensity exercise (any activity that increases your heart rate and causes you to sweat) each week. In addition, most adults need muscle-strengthening exercises on 2 or more days a week.   Maintain a healthy weight. The body mass index (BMI) is a screening tool to identify possible weight problems. It provides an estimate of body fat based on height and weight. Your health care provider can find your BMI and can help you achieve or maintain a healthy weight. For males 20 years and older:  A BMI below 18.5 is considered underweight.  A BMI of 18.5 to 24.9 is normal.  A BMI of 25 to 29.9 is considered overweight.  A BMI of 30 and above is considered obese.  Maintain normal blood lipids and cholesterol by exercising and minimizing your intake of saturated fat. Eat a balanced diet with plenty of fruits and vegetables. Blood tests for lipids and cholesterol should begin at age 59 and be repeated every 5 years. If your lipid or cholesterol levels are high, you are over age 40, or  you are at high risk for heart disease, you may need your cholesterol levels checked more frequently.Ongoing high lipid and cholesterol levels should be treated with medicines if diet and exercise are not working.  If you smoke, find out from your health care provider how to quit. If you do not use tobacco, do not start.  Lung cancer screening is recommended for adults aged 61-80 years who are at high risk for developing lung cancer because of a history of smoking. A yearly low-dose CT scan of the lungs is recommended for people who have at least a 30-pack-year history of smoking and are current smokers or have quit within the past 15 years. A pack year of smoking is smoking an average of 1 pack of cigarettes a day for 1 year (for example, a 30-pack-year history of smoking could mean smoking 1 pack a day for 30 years or 2 packs a day for 15 years). Yearly screening should continue until the smoker has stopped smoking for at least 15 years. Yearly screening should be stopped for people who develop a health problem that would prevent them from having lung cancer treatment.  If you choose to drink alcohol, do not have more than 2 drinks per day. One drink is considered to be 12 oz (360 mL) of beer, 5 oz (150 mL) of wine, or 1.5 oz (45 mL) of liquor.  Avoid the use of street drugs. Do not share  needles with anyone. Ask for help if you need support or instructions about stopping the use of drugs.  High blood pressure causes heart disease and increases the risk of stroke. High blood pressure is more likely to develop in:  People who have blood pressure in the end of the normal range (100-139/85-89 mm Hg).  People who are overweight or obese.  People who are African American.  If you are 3-24 years of age, have your blood pressure checked every 3-5 years. If you are 74 years of age or older, have your blood pressure checked every year. You should have your blood pressure measured twice--once when you  are at a hospital or clinic, and once when you are not at a hospital or clinic. Record the average of the two measurements. To check your blood pressure when you are not at a hospital or clinic, you can use:  An automated blood pressure machine at a pharmacy.  A home blood pressure monitor.  If you are 79-97 years old, ask your health care provider if you should take aspirin to prevent heart disease.  Diabetes screening involves taking a blood sample to check your fasting blood sugar level. This should be done once every 3 years after age 56 if you are at a normal weight and without risk factors for diabetes. Testing should be considered at a younger age or be carried out more frequently if you are overweight and have at least 1 risk factor for diabetes.  Colorectal cancer can be detected and often prevented. Most routine colorectal cancer screening begins at the age of 44 and continues through age 45. However, your health care provider may recommend screening at an earlier age if you have risk factors for colon cancer. On a yearly basis, your health care provider may provide home test kits to check for hidden blood in the stool. A small camera at the end of a tube may be used to directly examine the colon (sigmoidoscopy or colonoscopy) to detect the earliest forms of colorectal cancer. Talk to your health care provider about this at age 17 when routine screening begins. A direct exam of the colon should be repeated every 5-10 years through age 13, unless early forms of precancerous polyps or small growths are found.  People who are at an increased risk for hepatitis B should be screened for this virus. You are considered at high risk for hepatitis B if:  You were born in a country where hepatitis B occurs often. Talk with your health care provider about which countries are considered high risk.  Your parents were born in a high-risk country and you have not received a shot to protect against  hepatitis B (hepatitis B vaccine).  You have HIV or AIDS.  You use needles to inject street drugs.  You live with, or have sex with, someone who has hepatitis B.  You are a man who has sex with other men (MSM).  You get hemodialysis treatment.  You take certain medicines for conditions like cancer, organ transplantation, and autoimmune conditions.  Hepatitis C blood testing is recommended for all people born from [birth year] through 1964 and any individual with known risk factors for hepatitis C.  Healthy men should no longer receive prostate-specific antigen (PSA) blood tests as part of routine cancer screening. Talk to your health care provider about prostate cancer screening.  Testicular cancer screening is not recommended for adolescents or adult males who have no symptoms. Screening includes self-exam, a health care  provider exam, and other screening tests. Consult with your health care provider about any symptoms you have or any concerns you have about testicular cancer.  Practice safe sex. Use condoms and avoid high-risk sexual practices to reduce the spread of sexually transmitted infections (STIs).  You should be screened for STIs, including gonorrhea and chlamydia if:  You are sexually active and are younger than 24 years.  You are older than 24 years, and your health care provider tells you that you are at risk for this type of infection.  Your sexual activity has changed since you were last screened, and you are at an increased risk for chlamydia or gonorrhea. Ask your health care provider if you are at risk.  If you are at risk of being infected with HIV, it is recommended that you take a prescription medicine daily to prevent HIV infection. This is called pre-exposure prophylaxis (PrEP). You are considered at risk if:  You are a man who has sex with other men (MSM).  You are a heterosexual man who is sexually active with multiple partners.  You take drugs by  injection.  You are sexually active with a partner who has HIV.  Talk with your health care provider about whether you are at high risk of being infected with HIV. If you choose to begin PrEP, you should first be tested for HIV. You should then be tested every 3 months for as long as you are taking PrEP.  Use sunscreen. Apply sunscreen liberally and repeatedly throughout the day. You should seek shade when your shadow is shorter than you. Protect yourself by wearing long sleeves, pants, a wide-brimmed hat, and sunglasses year round whenever you are outdoors.  Tell your health care provider of new moles or changes in moles, especially if there is a change in shape or color. Also, tell your health care provider if a mole is larger than the size of a pencil eraser.  A one-time screening for abdominal aortic aneurysm (AAA) and surgical repair of large AAAs by ultrasound is recommended for men aged 50-75 years who are current or former smokers.  Stay current with your vaccines (immunizations).   This information is not intended to replace advice given to you by your health care provider. Make sure you discuss any questions you have with your health care provider.   Document Released: 01/13/2008 Document Revised: 08/07/2014 Document Reviewed: 12/12/2010 Elsevier Interactive Patient Education [employer].     Why follow it? Research shows. . Those who follow the Mediterranean diet have a reduced risk of heart disease  . The diet is associated with a reduced incidence of Parkinson's and Alzheimer's diseases . People following the diet may have longer life expectancies and lower rates of chronic diseases  . The Dietary Guidelines for Americans recommends the Mediterranean diet as an eating plan to promote health and prevent disease  What Is the Mediterranean Diet?  . Healthy eating plan based on typical foods and recipes of Mediterranean-style cooking . The diet is primarily a plant based  diet; these foods should make up a majority of meals   Starches - Plant based foods should make up a majority of meals - They are an important sources of vitamins, minerals, energy, antioxidants, and fiber - Choose whole grains, foods high in fiber and minimally processed items  - Typical grain sources include wheat, oats, barley, corn, brown rice, bulgar, farro, millet, polenta, couscous  - Various types of beans include chickpeas, lentils, fava beans,  black beans, white beans   Fruits  Veggies - Large quantities of antioxidant rich fruits & veggies; 6 or more servings  - Vegetables can be eaten raw or lightly drizzled with oil and cooked  - Vegetables common to the traditional Mediterranean Diet include: artichokes, arugula, beets, broccoli, brussel sprouts, cabbage, carrots, celery, collard greens, cucumbers, eggplant, kale, leeks, lemons, lettuce, mushrooms, okra, onions, peas, peppers, potatoes, pumpkin, radishes, rutabaga, shallots, spinach, sweet potatoes, turnips, zucchini - Fruits common to the Mediterranean Diet include: apples, apricots, avocados, cherries, clementines, dates, figs, grapefruits, grapes, melons, nectarines, oranges, peaches, pears, pomegranates, strawberries, tangerines  Fats - Replace butter and margarine with healthy oils, such as olive oil, canola oil, and tahini  - Limit nuts to no more than a handful a day  - Nuts include walnuts, almonds, pecans, pistachios, pine nuts  - Limit or avoid candied, honey roasted or heavily salted nuts - Olives are central to the [employer] - can be eaten whole or used in a variety of dishes   Meats Protein - Limiting red meat: no more than a few times a month - When eating red meat: choose lean cuts and keep the portion to the size of deck of cards - Eggs: approx. 0 to 4 times a week  - Fish and lean poultry: at least 2 a week  - Healthy protein sources include, chicken, [place], lean beef, lamb - Increase intake of seafood  such as tuna, salmon, trout, mackerel, shrimp, scallops - Avoid or limit high fat processed meats such as sausage and bacon  Dairy - Include moderate amounts of low fat dairy products  - Focus on healthy dairy such as fat free yogurt, skim milk, low or reduced fat cheese - Limit dairy products higher in fat such as whole or 2% milk, cheese, ice cream  Alcohol - Moderate amounts of red wine is ok  - No more than 5 oz daily for women (all ages) and men older than age 58  - No more than 10 oz of wine daily for men younger than 3  Other - Limit sweets and other desserts  - Use herbs and spices instead of salt to flavor foods  - Herbs and spices common to the traditional Mediterranean Diet include: basil, bay leaves, chives, cloves, cumin, fennel, garlic, lavender, marjoram, mint, oregano, parsley, pepper, rosemary, sage, savory, sumac, tarragon, thyme   It's not just a diet, it's a lifestyle:  . The Mediterranean diet includes lifestyle factors typical of those in the region  . Foods, drinks and meals are best eaten with others and savored . Daily physical activity is important for overall good health . This could be strenuous exercise like running and aerobics . This could also be more leisurely activities such as walking, housework, yard-work, or taking the stairs . Moderation is the key; a balanced and healthy diet accommodates most foods and drinks . Consider portion sizes and frequency of consumption of certain foods   Meal Ideas & Options:  . Breakfast:  o Whole wheat toast or whole wheat English muffins with peanut butter & hard boiled egg o Steel cut oats topped with apples & cinnamon and skim milk  o Fresh fruit: banana, strawberries, melon, berries, peaches  o Smoothies: strawberries, bananas, greek yogurt, peanut butter o Low fat greek yogurt with blueberries and granola  o Egg white omelet with spinach and mushrooms o Breakfast couscous: whole wheat couscous, apricots, skim  milk, cranberries  . Sandwiches:  o Hummus and  grilled vegetables (peppers, zucchini, squash) on whole wheat bread   o Grilled chicken on whole wheat pita with lettuce, tomatoes, cucumbers or tzatziki  o Tuna salad on whole wheat bread: tuna salad made with greek yogurt, olives, red peppers, capers, green onions o Garlic rosemary lamb pita: lamb sauted with garlic, rosemary, salt & pepper; add lettuce, cucumber, greek yogurt to pita - flavor with lemon juice and black pepper  . Seafood:  o Mediterranean grilled salmon, seasoned with garlic, basil, parsley, lemon juice and black pepper o Shrimp, lemon, and spinach whole-grain pasta salad made with low fat greek yogurt  o Seared scallops with lemon orzo  o Seared tuna steaks seasoned salt, pepper, coriander topped with tomato mixture of olives, tomatoes, olive oil, minced garlic, parsley, green onions and cappers  . Meats:  o Herbed greek chicken salad with kalamata olives, cucumber, feta  o Red bell peppers stuffed with spinach, bulgur, lean ground beef (or lentils) & topped with feta   o Kebabs: skewers of chicken, tomatoes, onions, zucchini, squash  o [place] burgers: made with red onions, mint, dill, lemon juice, feta cheese topped with roasted red peppers . Vegetarian o Cucumber salad: cucumbers, artichoke hearts, celery, red onion, feta cheese, tossed in olive oil & lemon juice  o Hummus and whole grain pita points with a greek salad (lettuce, tomato, feta, olives, cucumbers, red onion) o Lentil soup with celery, carrots made with vegetable broth, garlic, salt and pepper  o Tabouli salad: parsley, bulgur, mint, scallions, cucumbers, tomato, radishes, lemon juice, olive oil, salt and pepper.

## 2015-11-19 NOTE — Progress Notes (Signed)
Pre visit review using our clinic review tool, if applicable. No additional management support is needed unless otherwise documented below in the visit note.  Chief Complaint  Patient presents with  . Annual Exam  . Hyperlipidemia    HPI: Patient  Carlos Gaines  60 y.o. comes in today for [place] visit  Eating  "Like  A  Animal "training     For exercises event  Was on 240 and now doesnt to 229. woking on it  Quit sweet tea recently. No stone sx. 'had bcca removed lip albertinini and doing well  LIPID Med nose of med  Feels healthy  Not eating  Poca Maintenance  Topic Date Due  . Hepatitis C Screening  1956/07/22  . HIV Screening  09/22/1971  . INFLUENZA VACCINE  02/29/2016  . COLONOSCOPY  07/31/2016  . TETANUS/TDAP  11/18/2023   Health Maintenance Review LIFESTYLE:  Exercise:   Benching  Tobacco/ETS: no Alcohol:    2 beers per weekend day  Sugar beverages:  Sweet tea  Before stopped . Gallon in 3 days .  Sleep: 7 hours  40 hours per week Drug use: no    ROS:  GEN/ HEENT: No fever, significant weight changes sweats headaches vision problems hearing changes, CV/ PULM; No chest pain shortness of breath cough, syncope,edema  change in exercise tolerance. GI /GU: No adominal pain, vomiting, change in bowel habits. No blood in the stool. No significant GU symptoms. SKIN/HEME: ,no acute skin rashes suspicious lesions or bleeding. No lymphadenopathy, nodules, masses.  NEURO/ PSYCH:  No neurologic signs such as weakness numbness. No depression anxiety. IMM/ Allergy: No unusual infections.  Allergy .   REST of 12 system review negative except as per HPI   Past Medical History  Diagnosis Date  . GERD (gastroesophageal reflux disease)     endo neg x 2? on  chornnic ppi  . Hyperlipidemia     orig  270 ranges  on meds for 10 years   . Asthma     hx allergy shots in [place]   . Chicken pox   . Heart murmur     cardiac cath neg for  sob   . Hypertension     white coat   . UTI (lower urinary tract infection)     x 1 with fever   . H/O cardiac catheterization     neg wu for sob  in ed   . Basal cell carcinoma, lip     albertinei resection 2017  . Nephrolithiasis, uric acid     herrick no hx of gout  fam hx of gout    Past Surgical History  Procedure Laterality Date  . Shoulder repair      2009  . Tonsillectomy      1963  . Cardiac catheterization      Family History  Problem Relation Age of Onset  . Arthritis Father   . Hyperlipidemia Father   . Aneurysm Father   . Graves' disease Sister     Social History   Social History  . Marital Status: Married    Spouse Name: N/A  . Number of Children: N/A  . Years of Education: N/A   Social History Main Topics  . Smoking status: Never Smoker   . Smokeless tobacco: Never Used  . Alcohol Use: Yes  . Drug Use: None  . Sexual Activity: Not Asked   Other Topics Concern  . None  Social History Narrative   7 hours of sleep per night   hh of 2    Patient lives with his wife.   [name]:  Moved from [place].    Job Conservation officer, historic buildings ;. 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   Brother good health    Used to do weigh lifting now does som shot put     Outpatient Prescriptions Prior to Visit  Medication Sig Dispense Refill  . albuterol (PROVENTIL HFA;VENTOLIN HFA) 108 (90 BASE) MCG/ACT inhaler Inhale 2 puffs into the lungs every 6 (six) hours as needed for wheezing or shortness of breath. Generic albuterol HFA please 1 Inhaler 2  . omeprazole (PRILOSEC) 20 MG capsule TAKE 1 BY MOUTH DAILY 90 capsule 0  . simvastatin (ZOCOR) 40 MG tablet TAKE 1 BY MOUTH EVERY EVENING 90 tablet 0   No facility-administered medications prior to visit.     EXAM:  BP 136/90 mmHg  Temp(Src) 98.3 F (36.8 C) (Oral)  Ht 5\' 10"  (1.778 m)  Wt 229 lb (103.874 kg)  BMI 32.86 kg/m2  Body mass index is 32.86 kg/(m^2).  Physical Exam: Vital  signs reviewed [id number] is a well-developed well-nourished alert cooperative    who appearsr stated age in no acute distress.  HEENT: normocephalic atraumatic , Eyes: PERRL EOM's full, conjunctiva clear, Nares: paten,t no deformity discharge or tenderness., Ears: no deformity EAC's clear TMs with normal landmarks. Mouth: clear OP, no lesions, edema.  Moist mucous membranes. Dentition in adequate repair. NECK: supple without masses, thyromegaly or bruits. CHEST/PULM:  Clear to auscultation and percussion breath sounds equal no wheeze , rales or rhonchi. No chest wall deformities or tenderness. CV: PMI is nondisplaced, S1 S2 no gallops, murmurs, rubs. Peripheral pulses are full without delay.No JVD .  ABDOMEN: Bowel sounds normal nontender  No guard or rebound, no hepato splenomegal no CVA tenderness.   Extremtities:  No clubbing cyanosis or edema, no acute joint swelling or redness no focal atrophy NEURO:  Oriented x3, cranial nerves 3-12 appear to be intact, no obvious focal weakness,gait within normal limits no abnormal reflexes or asymmetrical SKIN: No acute rashes normal turgor, color, no bruising or petechiae. Well healed scar right lip PSYCH: Oriented, good eye contact, no obvious depression anxiety, cognition and judgment appear normal. LN: no cervical axillary inguinal adenopathy  Lab Results  Component Value Date   WBC 7.4 11/15/2015   HGB 14.9 11/15/2015   HCT 43.9 11/15/2015   PLT 271.0 11/15/2015   GLUCOSE 112* 11/15/2015   CHOL 183 11/15/2015   TRIG 201.0* 11/15/2015   HDL 38.40* 11/15/2015   LDLDIRECT 106.0 11/15/2015   LDLCALC 82 11/13/2014   ALT 25 11/15/2015   AST 25 11/15/2015   NA 140 11/15/2015   K 4.4 11/15/2015   CL 105 11/15/2015   CREATININE 1.07 11/15/2015   BUN 20 11/15/2015   CO2 26 11/15/2015   TSH 2.96 11/15/2015   PSA 0.32 11/15/2015   Lab reveiwed  ASSESSMENT AND PLAN:  Discussed the following assessment and plan:  Visit for preventive health  examination  HLD (hyperlipidemia) - stayon med intesnify lsi  Fasting hyperglycemia - will check a1c at next labs lsi  Patient Care Team: Burnis Medin, MD as PCP - General (Internal Medicine) Hillery Jacks, MD as Referring Physician (Dermatology) Patient Instructions   Avoid  Sweet tea sugars  Processed carbs   .Marland Kitchen  As you plan Mediterranean diet is  the healthiest Stay on same meds . Labs in 4-6 months and then OV to assess .  Wt Readings from Last 3 Encounters:  11/22/15 229 lb (103.874 kg)  01/04/15 224 lb 14.4 oz (102.014 kg)  11/20/14 218 lb 1.6 oz (98.93 kg)       Health Maintenance, Male A healthy lifestyle and preventative care can promote health and wellness.  Maintain regular health, dental, and eye exams.  Eat a healthy diet. Foods like vegetables, fruits, whole grains, low-fat dairy products, and lean protein foods contain the nutrients you need and are low in calories. Decrease your intake of foods high in solid fats, added sugars, and salt. Get information about a proper diet from your health care provider, if necessary.  Regular physical exercise is one of the most important things you can do for your health. Most adults should get at least 150 minutes of moderate-intensity exercise (any activity that increases your heart rate and causes you to sweat) each week. In addition, most adults need muscle-strengthening exercises on 2 or more days a week.   Maintain a healthy weight. The body mass index (BMI) is a screening tool to identify possible weight problems. It provides an estimate of body fat based on height and weight. Your health care provider can find your BMI and can help you achieve or maintain a healthy weight. For males 20 years and older:  A BMI below 18.5 is considered underweight.  A BMI of 18.5 to 24.9 is normal.  A BMI of 25 to 29.9 is considered overweight.  A BMI of 30 and above is considered obese.  Maintain normal blood lipids and  cholesterol by exercising and minimizing your intake of saturated fat. Eat a balanced diet with plenty of fruits and vegetables. Blood tests for lipids and cholesterol should begin at age 22 and be repeated every 5 years. If your lipid or cholesterol levels are high, you are over age 45, or you are at high risk for heart disease, you may need your cholesterol levels checked more frequently.Ongoing high lipid and cholesterol levels should be treated with medicines if diet and exercise are not working.  If you smoke, find out from your health care provider how to quit. If you do not use tobacco, do not start.  Lung cancer screening is recommended for adults aged 3-80 years who are at high risk for developing lung cancer because of a history of smoking. A yearly low-dose CT scan of the lungs is recommended for people who have at least a 30-pack-year history of smoking and are current smokers or have quit within the past 15 years. A pack year of smoking is smoking an average of 1 pack of cigarettes a day for 1 year (for example, a 30-pack-year history of smoking could mean smoking 1 pack a day for 30 years or 2 packs a day for 15 years). Yearly screening should continue until the smoker has stopped smoking for at least 15 years. Yearly screening should be stopped for people who develop a health problem that would prevent them from having lung cancer treatment.  If you choose to drink alcohol, do not have more than 2 drinks per day. One drink is considered to be 12 oz (360 mL) of beer, 5 oz (150 mL) of wine, or 1.5 oz (45 mL) of liquor.  Avoid the use of street drugs. Do not share needles with anyone. Ask for help if you need support or instructions about stopping the use of drugs.  High blood pressure causes heart disease and increases the risk of stroke. High blood pressure is more likely to develop in:  People who have blood pressure in the end of the normal range (100-139/85-89 mm Hg).  People who are  overweight or obese.  People who are African American.  If you are 34-83 years of age, have your blood pressure checked every 3-5 years. If you are 5 years of age or older, have your blood pressure checked every year. You should have your blood pressure measured twice--once when you are at a hospital or clinic, and once when you are not at a hospital or clinic. Record the average of the two measurements. To check your blood pressure when you are not at a hospital or clinic, you can use:  An automated blood pressure machine at a pharmacy.  A home blood pressure monitor.  If you are 88-85 years old, ask your health care provider if you should take aspirin to prevent heart disease.  Diabetes screening involves taking a blood sample to check your fasting blood sugar level. This should be done once every 3 years after age 89 if you are at a normal weight and without risk factors for diabetes. Testing should be considered at a younger age or be carried out more frequently if you are overweight and have at least 1 risk factor for diabetes.  Colorectal cancer can be detected and often prevented. Most routine colorectal cancer screening begins at the age of 46 and continues through age 64. However, your health care provider may recommend screening at an earlier age if you have risk factors for colon cancer. On a yearly basis, your health care provider may provide home test kits to check for hidden blood in the stool. A small camera at the end of a tube may be used to directly examine the colon (sigmoidoscopy or colonoscopy) to detect the earliest forms of colorectal cancer. Talk to your health care provider about this at age 35 when routine screening begins. A direct exam of the colon should be repeated every 5-10 years through age 48, unless early forms of precancerous polyps or small growths are found.  People who are at an increased risk for hepatitis B should be screened for this virus. You are  considered at high risk for hepatitis B if:  You were born in a country where hepatitis B occurs often. Talk with your health care provider about which countries are considered high risk.  Your parents were born in a high-risk country and you have not received a shot to protect against hepatitis B (hepatitis B vaccine).  You have HIV or AIDS.  You use needles to inject street drugs.  You live with, or have sex with, someone who has hepatitis B.  You are a man who has sex with other men (MSM).  You get hemodialysis treatment.  You take certain medicines for conditions like cancer, organ transplantation, and autoimmune conditions.  Hepatitis C blood testing is recommended for all people born from [birth year] through 1964 and any individual with known risk factors for hepatitis C.  Healthy men should no longer receive prostate-specific antigen (PSA) blood tests as part of routine cancer screening. Talk to your health care provider about prostate cancer screening.  Testicular cancer screening is not recommended for adolescents or adult males who have no symptoms. Screening includes self-exam, a health care provider exam, and other screening tests. Consult with your health care provider about any symptoms you have or any  concerns you have about testicular cancer.  Practice safe sex. Use condoms and avoid high-risk sexual practices to reduce the spread of sexually transmitted infections (STIs).  You should be screened for STIs, including gonorrhea and chlamydia if:  You are sexually active and are younger than 24 years.  You are older than 24 years, and your health care provider tells you that you are at risk for this type of infection.  Your sexual activity has changed since you were last screened, and you are at an increased risk for chlamydia or gonorrhea. Ask your health care provider if you are at risk.  If you are at risk of being infected with HIV, it is recommended that you take a  prescription medicine daily to prevent HIV infection. This is called pre-exposure prophylaxis (PrEP). You are considered at risk if:  You are a man who has sex with other men (MSM).  You are a heterosexual man who is sexually active with multiple partners.  You take drugs by injection.  You are sexually active with a partner who has HIV.  Talk with your health care provider about whether you are at high risk of being infected with HIV. If you choose to begin PrEP, you should first be tested for HIV. You should then be tested every 3 months for as long as you are taking PrEP.  Use sunscreen. Apply sunscreen liberally and repeatedly throughout the day. You should seek shade when your shadow is shorter than you. Protect yourself by wearing long sleeves, pants, a wide-brimmed hat, and sunglasses year round whenever you are outdoors.  Tell your health care provider of new moles or changes in moles, especially if there is a change in shape or color. Also, tell your health care provider if a mole is larger than the size of a pencil eraser.  A one-time screening for abdominal aortic aneurysm (AAA) and surgical repair of large AAAs by ultrasound is recommended for men aged 6-75 years who are current or former smokers.  Stay current with your vaccines (immunizations).   This information is not intended to replace advice given to you by your health care provider. Make sure you discuss any questions you have with your health care provider.   Document Released: 01/13/2008 Document Revised: 08/07/2014 Document Reviewed: 12/12/2010 Elsevier Interactive Patient Education [employer].     Why follow it? Research shows. . Those who follow the Mediterranean diet have a reduced risk of heart disease  . The diet is associated with a reduced incidence of Parkinson's and Alzheimer's diseases . People following the diet may have longer life expectancies and lower rates of chronic diseases  . The Dietary  Guidelines for Americans recommends the Mediterranean diet as an eating plan to promote health and prevent disease  What Is the Mediterranean Diet?  . Healthy eating plan based on typical foods and recipes of Mediterranean-style cooking . The diet is primarily a plant based diet; these foods should make up a majority of meals   Starches - Plant based foods should make up a majority of meals - They are an important sources of vitamins, minerals, energy, antioxidants, and fiber - Choose whole grains, foods high in fiber and minimally processed items  - Typical grain sources include wheat, oats, barley, corn, brown rice, bulgar, farro, millet, polenta, couscous  - Various types of beans include chickpeas, lentils, fava beans, black beans, white beans   Fruits  Veggies - Large quantities of antioxidant rich fruits & veggies; 6  or more servings  - Vegetables can be eaten raw or lightly drizzled with oil and cooked  - Vegetables common to the traditional Mediterranean Diet include: artichokes, arugula, beets, broccoli, brussel sprouts, cabbage, carrots, celery, collard greens, cucumbers, eggplant, kale, leeks, lemons, lettuce, mushrooms, okra, onions, peas, peppers, potatoes, pumpkin, radishes, rutabaga, shallots, spinach, sweet potatoes, turnips, zucchini - Fruits common to the Mediterranean Diet include: apples, apricots, avocados, cherries, clementines, dates, figs, grapefruits, grapes, melons, nectarines, oranges, peaches, pears, pomegranates, strawberries, tangerines  Fats - Replace butter and margarine with healthy oils, such as olive oil, canola oil, and tahini  - Limit nuts to no more than a handful a day  - Nuts include walnuts, almonds, pecans, pistachios, pine nuts  - Limit or avoid candied, honey roasted or heavily salted nuts - Olives are central to the [employer] - can be eaten whole or used in a variety of dishes   Meats Protein - Limiting red meat: no more than a few times a  month - When eating red meat: choose lean cuts and keep the portion to the size of deck of cards - Eggs: approx. 0 to 4 times a week  - Fish and lean poultry: at least 2 a week  - Healthy protein sources include, chicken, [place], lean beef, lamb - Increase intake of seafood such as tuna, salmon, trout, mackerel, shrimp, scallops - Avoid or limit high fat processed meats such as sausage and bacon  Dairy - Include moderate amounts of low fat dairy products  - Focus on healthy dairy such as fat free yogurt, skim milk, low or reduced fat cheese - Limit dairy products higher in fat such as whole or 2% milk, cheese, ice cream  Alcohol - Moderate amounts of red wine is ok  - No more than 5 oz daily for women (all ages) and men older than age 82  - No more than 10 oz of wine daily for men younger than 6  Other - Limit sweets and other desserts  - Use herbs and spices instead of salt to flavor foods  - Herbs and spices common to the traditional Mediterranean Diet include: basil, bay leaves, chives, cloves, cumin, fennel, garlic, lavender, marjoram, mint, oregano, parsley, pepper, rosemary, sage, savory, sumac, tarragon, thyme   It's not just a diet, it's a lifestyle:  . The Mediterranean diet includes lifestyle factors typical of those in the region  . Foods, drinks and meals are best eaten with others and savored . Daily physical activity is important for overall good health . This could be strenuous exercise like running and aerobics . This could also be more leisurely activities such as walking, housework, yard-work, or taking the stairs . Moderation is the key; a balanced and healthy diet accommodates most foods and drinks . Consider portion sizes and frequency of consumption of certain foods   Meal Ideas & Options:  . Breakfast:  o Whole wheat toast or whole wheat English muffins with peanut butter & hard boiled egg o Steel cut oats topped with apples & cinnamon and skim milk  o Fresh  fruit: banana, strawberries, melon, berries, peaches  o Smoothies: strawberries, bananas, greek yogurt, peanut butter o Low fat greek yogurt with blueberries and granola  o Egg white omelet with spinach and mushrooms o Breakfast couscous: whole wheat couscous, apricots, skim milk, cranberries  . Sandwiches:  o Hummus and grilled vegetables (peppers, zucchini, squash) on whole wheat bread   o Grilled chicken on whole wheat pita with  lettuce, tomatoes, cucumbers or tzatziki  o Tuna salad on whole wheat bread: tuna salad made with greek yogurt, olives, red peppers, capers, green onions o Garlic rosemary lamb pita: lamb sauted with garlic, rosemary, salt & pepper; add lettuce, cucumber, greek yogurt to pita - flavor with lemon juice and black pepper  . Seafood:  o Mediterranean grilled salmon, seasoned with garlic, basil, parsley, lemon juice and black pepper o Shrimp, lemon, and spinach whole-grain pasta salad made with low fat greek yogurt  o Seared scallops with lemon orzo  o Seared tuna steaks seasoned salt, pepper, coriander topped with tomato mixture of olives, tomatoes, olive oil, minced garlic, parsley, green onions and cappers  . Meats:  o Herbed greek chicken salad with kalamata olives, cucumber, feta  o Red bell peppers stuffed with spinach, bulgur, lean ground beef (or lentils) & topped with feta   o Kebabs: skewers of chicken, tomatoes, onions, zucchini, squash  o [place] burgers: made with red onions, mint, dill, lemon juice, feta cheese topped with roasted red peppers . Vegetarian o Cucumber salad: cucumbers, artichoke hearts, celery, red onion, feta cheese, tossed in olive oil & lemon juice  o Hummus and whole grain pita points with a greek salad (lettuce, tomato, feta, olives, cucumbers, red onion) o Lentil soup with celery, carrots made with vegetable broth, garlic, salt and pepper  o Tabouli salad: parsley, bulgur, mint, scallions, cucumbers, tomato, radishes, lemon juice,  olive oil, salt and pepper.         Standley Brooking. Panosh M.D.

## 2015-11-22 ENCOUNTER — Encounter: Payer: Self-pay | Admitting: Internal Medicine

## 2015-11-22 ENCOUNTER — Ambulatory Visit (INDEPENDENT_AMBULATORY_CARE_PROVIDER_SITE_OTHER): Payer: BLUE CROSS/BLUE SHIELD | Admitting: Internal Medicine

## 2015-11-22 VITALS — BP 136/90 | Temp 98.3°F | Ht 70.0 in | Wt 229.0 lb

## 2015-11-22 DIAGNOSIS — E785 Hyperlipidemia, unspecified: Secondary | ICD-10-CM

## 2015-11-22 DIAGNOSIS — R7301 Impaired fasting glucose: Secondary | ICD-10-CM

## 2015-11-22 DIAGNOSIS — Z0000 Encounter for general adult medical examination without abnormal findings: Secondary | ICD-10-CM | POA: Diagnosis not present

## 2015-12-08 ENCOUNTER — Encounter: Payer: Self-pay | Admitting: Internal Medicine

## 2015-12-08 ENCOUNTER — Ambulatory Visit (INDEPENDENT_AMBULATORY_CARE_PROVIDER_SITE_OTHER): Payer: BLUE CROSS/BLUE SHIELD | Admitting: Internal Medicine

## 2015-12-08 VITALS — BP 120/80 | HR 73 | Temp 98.8°F | Ht 70.0 in | Wt 224.6 lb

## 2015-12-08 DIAGNOSIS — B9789 Other viral agents as the cause of diseases classified elsewhere: Secondary | ICD-10-CM

## 2015-12-08 DIAGNOSIS — J069 Acute upper respiratory infection, unspecified: Secondary | ICD-10-CM

## 2015-12-08 DIAGNOSIS — J40 Bronchitis, not specified as acute or chronic: Secondary | ICD-10-CM | POA: Diagnosis not present

## 2015-12-08 NOTE — Patient Instructions (Signed)
This acts like  A wheezy bronchitis and probably viral  Inhaler  As needed.  No excess exercise yet.  If not improved before the weekend.  Or worsening  Fever shortness of breath .  Get the chest x ray .  To make sure we are not missing pneumonia .   Acute Bronchitis Bronchitis is inflammation of the airways that extend from the windpipe into the lungs (bronchi). The inflammation often causes mucus to develop. This leads to a cough, which is the most common symptom of bronchitis.  In acute bronchitis, the condition usually develops suddenly and goes away over time, usually in a couple weeks. Smoking, allergies, and asthma can make bronchitis worse. Repeated episodes of bronchitis may cause further lung problems.  CAUSES Acute bronchitis is most often caused by the same virus that causes a cold. The virus can spread from person to person (contagious) through coughing, sneezing, and touching contaminated objects. SIGNS AND SYMPTOMS   Cough.   Fever.   Coughing up mucus.   Body aches.   Chest congestion.   Chills.   Shortness of breath.   Sore throat.  DIAGNOSIS  Acute bronchitis is usually diagnosed through a physical exam. Your health care provider will also ask you questions about your medical history. Tests, such as chest X-rays, are sometimes done to rule out other conditions.  TREATMENT  Acute bronchitis usually goes away in a couple weeks. Oftentimes, no medical treatment is necessary. Medicines are sometimes given for relief of fever or cough. Antibiotic medicines are usually not needed but may be prescribed in certain situations. In some cases, an inhaler may be recommended to help reduce shortness of breath and control the cough. A cool mist vaporizer may also be used to help thin bronchial secretions and make it easier to clear the chest.  HOME CARE INSTRUCTIONS  Get plenty of rest.   Drink enough fluids to keep your urine clear or pale yellow (unless you have a  medical condition that requires fluid restriction). Increasing fluids may help thin your respiratory secretions (sputum) and reduce chest congestion, and it will prevent dehydration.   Take medicines only as directed by your health care provider.  If you were prescribed an antibiotic medicine, finish it all even if you start to feel better.  Avoid smoking and secondhand smoke. Exposure to cigarette smoke or irritating chemicals will make bronchitis worse. If you are a smoker, consider using nicotine gum or skin patches to help control withdrawal symptoms. Quitting smoking will help your lungs heal faster.   Reduce the chances of another bout of acute bronchitis by washing your hands frequently, avoiding people with cold symptoms, and trying not to touch your hands to your mouth, nose, or eyes.   Keep all follow-up visits as directed by your health care provider.  SEEK MEDICAL CARE IF: Your symptoms do not improve after 1 week of treatment.  SEEK IMMEDIATE MEDICAL CARE IF:  You develop an increased fever or chills.   You have chest pain.   You have severe shortness of breath.  You have bloody sputum.   You develop dehydration.  You faint or repeatedly feel like you are going to pass out.  You develop repeated vomiting.  You develop a severe headache. MAKE SURE YOU:   Understand these instructions.  Will watch your condition.  Will get help right away if you are not doing well or get worse.   This information is not intended to replace advice given to you  by your health care provider. Make sure you discuss any questions you have with your health care provider.   Document Released: 08/24/2004 Document Revised: 08/07/2014 Document Reviewed: 01/07/2013 Elsevier Interactive Patient Education [employer].

## 2015-12-08 NOTE — Progress Notes (Signed)
Chief Complaint  Patient presents with  . Nasal Congestion  . Cough    since saturday, low grade fever at night, some chills and body aches.     HPI: Carlos Gaines 60 y.o. comes in for [place] kids  And    Wife  And 2 night very bad with cough at night and did  Maximum strength robitussin.  Last night about 99 . 5     Fells liek chest cold that he gets and then  Evolves   No sob dose feel wheezy no hemoptysis some yellow green phelgm . Onset 3-4 days  Initially 101 temp now 99  Has inhaler not using at this time  otc seems to help the cough at  Night    Working on weight loss  Wt Readings from Last 3 Encounters:  12/08/15 224 lb 9.6 oz (101.878 kg)  11/22/15 229 lb (103.874 kg)  01/04/15 224 lb 14.4 oz (102.014 kg)    ROS: See pertinent positives and negatives per HPI.  Past Medical History  Diagnosis Date  . GERD (gastroesophageal reflux disease)     endo neg x 2? on  chornnic ppi  . Hyperlipidemia     orig  270 ranges  on meds for 10 years   . Asthma     hx allergy shots in [place]   . Chicken pox   . Heart murmur     cardiac cath neg for sob   . Hypertension     white coat   . UTI (lower urinary tract infection)     x 1 with fever   . H/O cardiac catheterization     neg wu for sob  in ed   . Basal cell carcinoma, lip     albertinei resection 2017  . Nephrolithiasis, uric acid     herrick no hx of gout  fam hx of gout    Family History  Problem Relation Age of Onset  . Arthritis Father   . Hyperlipidemia Father   . Aneurysm Father   . Graves' disease Sister     Social History   Social History  . Marital Status: Married    Spouse Name: N/A  . Number of Children: N/A  . Years of Education: N/A   Social History Main Topics  . Smoking status: Never Smoker   . Smokeless tobacco: Never Used  . Alcohol Use: Yes  . Drug Use: None  . Sexual Activity: Not Asked   Other Topics Concern  . None   Social History Narrative   7 hours of sleep per night   hh  of 2    Patient lives with his wife.   [name]:  Moved from [place].    Job Conservation officer, historic buildings ;. 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   Brother good health    Used to do weigh lifting now does som shot put     Outpatient Prescriptions Prior to Visit  Medication Sig Dispense Refill  . albuterol (PROVENTIL HFA;VENTOLIN HFA) 108 (90 BASE) MCG/ACT inhaler Inhale 2 puffs into the lungs every 6 (six) hours as needed for wheezing or shortness of breath. Generic albuterol HFA please 1 Inhaler 2  . omeprazole (PRILOSEC) 20 MG capsule TAKE 1 BY MOUTH DAILY 90 capsule 0  . simvastatin (ZOCOR) 40 MG tablet TAKE 1 BY MOUTH EVERY EVENING 90 tablet 0   No facility-administered medications prior to visit.  EXAM:  BP 120/80 mmHg  Pulse 73  Temp(Src) 98.8 F (37.1 C)  Ht 5\' 10"  (1.778 m)  Wt 224 lb 9.6 oz (101.878 kg)  BMI 32.23 kg/m2  SpO2 95%  Body mass index is 32.23 kg/(m^2).  GENERAL: vitals reviewed and listed above, alert, oriented, appears well hydrated and in no acute distress non toxic  HEENT: atraumatic, conjunctiva  clear, no obvious abnormalities on inspection of external nose and ears tms clear  Face nt OP : no lesion edema or exudate  NECK: no obvious masses on inspection palpation  LUNGS:  Wheeze musical   And  Large airwy sounds  No  Rales  Or dec bs  good air movement CV: HRRR, no clubbing cyanosis or  peripheral edema nl cap refill  MS: moves all extremities without noticeable focal  abnormality PSYCH: pleasant and cooperative, no obvious depression or anxiety  ASSESSMENT AND PLAN:  Discussed the following assessment and plan:  Wheezy bronchitis - hx of same get chest xray if not improving of as discussed , prob viral  expectant managment  - Plan: DG Chest 2 View  Viral upper respiratory tract infection with cough - Plan: DG Chest 2 View Use inhaler and get x ray as planned if not improving  Or fever.   -Patient advised to return or  notify health care team  if symptoms worsen ,persist or new concerns arise. Declined cough med feels  otc is fine at this time  Patient Instructions  This acts like  A wheezy bronchitis and probably viral  Inhaler  As needed.  No excess exercise yet.  If not improved before the weekend.  Or worsening  Fever shortness of breath .  Get the chest x ray .  To make sure we are not missing pneumonia .   Acute Bronchitis Bronchitis is inflammation of the airways that extend from the windpipe into the lungs (bronchi). The inflammation often causes mucus to develop. This leads to a cough, which is the most common symptom of bronchitis.  In acute bronchitis, the condition usually develops suddenly and goes away over time, usually in a couple weeks. Smoking, allergies, and asthma can make bronchitis worse. Repeated episodes of bronchitis may cause further lung problems.  CAUSES Acute bronchitis is most often caused by the same virus that causes a cold. The virus can spread from person to person (contagious) through coughing, sneezing, and touching contaminated objects. SIGNS AND SYMPTOMS   Cough.   Fever.   Coughing up mucus.   Body aches.   Chest congestion.   Chills.   Shortness of breath.   Sore throat.  DIAGNOSIS  Acute bronchitis is usually diagnosed through a physical exam. Your health care provider will also ask you questions about your medical history. Tests, such as chest X-rays, are sometimes done to rule out other conditions.  TREATMENT  Acute bronchitis usually goes away in a couple weeks. Oftentimes, no medical treatment is necessary. Medicines are sometimes given for relief of fever or cough. Antibiotic medicines are usually not needed but may be prescribed in certain situations. In some cases, an inhaler may be recommended to help reduce shortness of breath and control the cough. A cool mist vaporizer may also be used to help thin bronchial secretions and make it easier  to clear the chest.  HOME CARE INSTRUCTIONS  Get plenty of rest.   Drink enough fluids to keep your urine clear or pale yellow (unless you have a medical condition that requires fluid  restriction). Increasing fluids may help thin your respiratory secretions (sputum) and reduce chest congestion, and it will prevent dehydration.   Take medicines only as directed by your health care provider.  If you were prescribed an antibiotic medicine, finish it all even if you start to feel better.  Avoid smoking and secondhand smoke. Exposure to cigarette smoke or irritating chemicals will make bronchitis worse. If you are a smoker, consider using nicotine gum or skin patches to help control withdrawal symptoms. Quitting smoking will help your lungs heal faster.   Reduce the chances of another bout of acute bronchitis by washing your hands frequently, avoiding people with cold symptoms, and trying not to touch your hands to your mouth, nose, or eyes.   Keep all follow-up visits as directed by your health care provider.  SEEK MEDICAL CARE IF: Your symptoms do not improve after 1 week of treatment.  SEEK IMMEDIATE MEDICAL CARE IF:  You develop an increased fever or chills.   You have chest pain.   You have severe shortness of breath.  You have bloody sputum.   You develop dehydration.  You faint or repeatedly feel like you are going to pass out.  You develop repeated vomiting.  You develop a severe headache. MAKE SURE YOU:   Understand these instructions.  Will watch your condition.  Will get help right away if you are not doing well or get worse.   This information is not intended to replace advice given to you by your health care provider. Make sure you discuss any questions you have with your health care provider.   Document Released: 08/24/2004 Document Revised: 08/07/2014 Document Reviewed: 01/07/2013 Elsevier Interactive Patient Education 2016 [place] K. Safa Derner M.D.

## 2015-12-10 ENCOUNTER — Encounter: Payer: Self-pay | Admitting: Internal Medicine

## 2015-12-10 MED ORDER — ALBUTEROL SULFATE HFA 108 (90 BASE) MCG/ACT IN AERS: 2.0000 | INHALATION_SPRAY | Freq: Four times a day (QID) | RESPIRATORY_TRACT | Status: DC | PRN

## 2016-02-10 ENCOUNTER — Encounter: Payer: Self-pay | Admitting: Internal Medicine

## 2016-02-10 ENCOUNTER — Ambulatory Visit (INDEPENDENT_AMBULATORY_CARE_PROVIDER_SITE_OTHER): Payer: BLUE CROSS/BLUE SHIELD | Admitting: Internal Medicine

## 2016-02-10 VITALS — BP 134/84 | Temp 98.4°F | Wt 226.2 lb

## 2016-02-10 DIAGNOSIS — L089 Local infection of the skin and subcutaneous tissue, unspecified: Secondary | ICD-10-CM

## 2016-02-10 DIAGNOSIS — T148XXA Other injury of unspecified body region, initial encounter: Secondary | ICD-10-CM

## 2016-02-10 DIAGNOSIS — T148 Other injury of unspecified body region: Secondary | ICD-10-CM

## 2016-02-10 MED ORDER — DOXYCYCLINE HYCLATE 100 MG PO TABS: 100.0000 mg | ORAL_TABLET | Freq: Two times a day (BID) | ORAL | Status: DC

## 2016-02-10 NOTE — Patient Instructions (Addendum)
Warm compresses to area  Not sure which    bite or  Boil  Add antibiotic  At this time  If  persistent or progressive  Or fever contact us

## 2016-02-10 NOTE — Progress Notes (Signed)
Chief Complaint  Patient presents with  . Insect Bite    On Back    HPI:  Carlos Gaines 60 y.o.  Walk in appt  Today  Noted tender angry lesion on back wife thought a tick bit but not definitely  removed?    She did dig at it  Not sure anything removed is outdoors a lot and gets exposed. aska bout lime it is tender and not  Itching    weaning Prilosec to 3 x per week .  ROS: See pertinent positives and negatives per HPI. No nfever other rash  Past Medical History  Diagnosis Date  . GERD (gastroesophageal reflux disease)     endo neg x 2? on  chornnic ppi  . Hyperlipidemia     orig  270 ranges  on meds for 10 years   . Asthma     hx allergy shots in [place]   . Chicken pox   . Heart murmur     cardiac cath neg for sob   . Hypertension     white coat   . UTI (lower urinary tract infection)     x 1 with fever   . H/O cardiac catheterization     neg wu for sob  in ed   . Basal cell carcinoma, lip     albertinei resection 2017  . Nephrolithiasis, uric acid     herrick no hx of gout  fam hx of gout    Family History  Problem Relation Age of Onset  . Arthritis Father   . Hyperlipidemia Father   . Aneurysm Father   . Graves' disease Sister     Social History   Social History  . Marital Status: Married    Spouse Name: N/A  . Number of Children: N/A  . Years of Education: N/A   Social History Main Topics  . Smoking status: Never Smoker   . Smokeless tobacco: Never Used  . Alcohol Use: Yes  . Drug Use: None  . Sexual Activity: Not Asked   Other Topics Concern  . None   Social History Narrative   7 hours of sleep per night   hh of 2    Patient lives with his wife.   [name]:  Moved from [place].    Job Conservation officer, historic buildings ;. 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   Brother good health    Used to do weigh lifting now does som shot put     Outpatient Prescriptions Prior to Visit  Medication Sig Dispense Refill  . albuterol  (PROVENTIL HFA;VENTOLIN HFA) 108 (90 Base) MCG/ACT inhaler Inhale 2 puffs into the lungs every 6 (six) hours as needed for wheezing or shortness of breath. Generic albuterol HFA please 1 Inhaler 0  . omeprazole (PRILOSEC) 20 MG capsule TAKE 1 BY MOUTH DAILY 90 capsule 0  . simvastatin (ZOCOR) 40 MG tablet TAKE 1 BY MOUTH EVERY EVENING 90 tablet 0   No facility-administered medications prior to visit.     EXAM:  BP 134/84 mmHg  Temp(Src) 98.4 F (36.9 C) (Oral)  Wt 226 lb 3.2 oz (102.604 kg)  Body mass index is 32.46 kg/(m^2).  GENERAL: vitals reviewed and listed above, alert, oriented, appears well hydrated and in no acute distress HEENT: atraumatic, conjunctiva  clear, no obvious abnormalities on inspection of external nose and ears  Skin back a 1.5 cm redness with center hemorrhagic center ( prev  manipulation  no fb seen    midl tender and raised  But no fluctuance   Streaking or central pore or vesicle PSYCH: pleasant and cooperative, no obvious depression or anxiety  ASSESSMENT AND PLAN:  Discussed the following assessment and plan:  Bite ?  Skin infection ? - see text   Expectant management. empiric antibiotic at this time  Local care  . Bite vs skin infection   feel  benefit more than risk of med  -Patient advised to return or notify health care team  if symptoms worsen ,persist or new concerns arise.  Patient Instructions  Warm compresses to area  Not sure which    bite or  Boil  Add antibiotic  At this time  If  persistent or progressive  Or fever contact us      Standley Brooking. Nalaysia Manganiello M.D.

## 2016-02-10 NOTE — Progress Notes (Signed)
Pre visit review using our clinic review tool, if applicable. No additional management support is needed unless otherwise documented below in the visit note. 

## 2016-03-15 ENCOUNTER — Other Ambulatory Visit: Payer: Self-pay | Admitting: Family Medicine

## 2016-03-15 DIAGNOSIS — E786 Lipoprotein deficiency: Secondary | ICD-10-CM

## 2016-03-15 DIAGNOSIS — R739 Hyperglycemia, unspecified: Secondary | ICD-10-CM

## 2016-03-15 DIAGNOSIS — N209 Urinary calculus, unspecified: Secondary | ICD-10-CM

## 2016-03-15 DIAGNOSIS — E785 Hyperlipidemia, unspecified: Secondary | ICD-10-CM

## 2016-03-16 ENCOUNTER — Other Ambulatory Visit (INDEPENDENT_AMBULATORY_CARE_PROVIDER_SITE_OTHER): Payer: BLUE CROSS/BLUE SHIELD

## 2016-03-16 DIAGNOSIS — R739 Hyperglycemia, unspecified: Secondary | ICD-10-CM

## 2016-03-16 DIAGNOSIS — E786 Lipoprotein deficiency: Secondary | ICD-10-CM | POA: Diagnosis not present

## 2016-03-16 DIAGNOSIS — E785 Hyperlipidemia, unspecified: Secondary | ICD-10-CM

## 2016-03-16 DIAGNOSIS — N209 Urinary calculus, unspecified: Secondary | ICD-10-CM | POA: Diagnosis not present

## 2016-03-16 LAB — LIPID PANEL
Cholesterol: 138 mg/dL (ref 0–200)
HDL: 42.3 mg/dL (ref >39.00)
LDL CALC: 72 mg/dL (ref 0–99)
NONHDL: 95.59
Total CHOL/HDL Ratio: 3
Triglycerides: 120 mg/dL (ref 0.0–149.0)
VLDL: 24 mg/dL (ref 0.0–40.0)

## 2016-03-16 LAB — HEMOGLOBIN A1C: HEMOGLOBIN A1C: 5.8 % (ref 4.6–6.5)

## 2016-03-16 LAB — URIC ACID: URIC ACID, SERUM: 3.5 mg/dL — AB (ref 4.0–7.8)

## 2016-03-17 NOTE — Progress Notes (Signed)
Pre visit review using our clinic review tool, if applicable. No additional management support is needed unless otherwise documented below in the visit note.  Chief Complaint  Patient presents with  . Follow-up    HPI: Carlos Gaines 60 y.o.  Fu lipids  bg bp  Since last visit has lost weight inc acitivity eating healthier getting rid of the "nasty stuff " sweet tea ice cream" daily now cooking with Olive oil fels well  Dec need for prilosec also  ROS: See pertinent positives and negatives per HPI.  Past Medical History:  Diagnosis Date  . Asthma    hx allergy shots in [place]   . Basal cell carcinoma, lip    albertinei resection 2017  . Chicken pox   . GERD (gastroesophageal reflux disease)    endo neg x 2? on  chornnic ppi  . H/O cardiac catheterization    neg wu for sob  in ed   . Heart murmur    cardiac cath neg for sob   . Hyperlipidemia    orig  270 ranges  on meds for 10 years   . Hypertension    white coat   . Nephrolithiasis, uric acid    herrick no hx of gout  fam hx of gout  . UTI (lower urinary tract infection)    x 1 with fever     Family History  Problem Relation Age of Onset  . Arthritis Father   . Hyperlipidemia Father   . Aneurysm Father   . Graves' disease Sister     Social History   Social History  . Marital status: Married    Spouse name: N/A  . Number of children: N/A  . Years of education: N/A   Social History Main Topics  . Smoking status: Never Smoker  . Smokeless tobacco: Never Used  . Alcohol use Yes  . Drug use: Unknown  . Sexual activity: Not Asked   Other Topics Concern  . None   Social History Narrative   7 hours of sleep per night   hh of 2    Patient lives with his wife.   [name]:  Moved from [place].    Job Conservation officer, historic buildings ;. 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   Brother good health    Used to do weigh lifting now does som shot put     Outpatient Medications Prior to Visit    Medication Sig Dispense Refill  . omeprazole (PRILOSEC) 20 MG capsule TAKE 1 BY MOUTH DAILY 90 capsule 0  . simvastatin (ZOCOR) 40 MG tablet TAKE 1 BY MOUTH EVERY EVENING 90 tablet 0  . albuterol (PROVENTIL HFA;VENTOLIN HFA) 108 (90 Base) MCG/ACT inhaler Inhale 2 puffs into the lungs every 6 (six) hours as needed for wheezing or shortness of breath. Generic albuterol HFA please (Patient not taking: Reported on 03/20/2016) 1 Inhaler 0  . doxycycline (VIBRA-TABS) 100 MG tablet Take 1 tablet (100 mg total) by mouth 2 (two) times daily. 20 tablet 0   No facility-administered medications prior to visit.      EXAM:  BP 134/80 (BP Location: Right Arm, Patient Position: Sitting, Cuff Size: Large)   Temp 98.2 F (36.8 C) (Oral)   Wt 217 lb 12.8 oz (98.8 kg)   BMI 31.25 kg/m   Body mass index is 31.25 kg/m.  GENERAL: vitals reviewed and listed above, alert, oriented, appears well hydrated and in no acute distress  HEENT: atraumatic, conjunctiva  clear, no obvious abnormalities on inspection of external nose and ears OP : no lesion edema or exudate  NECK: no obvious masses on inspection palpation  LUNGS: clear to auscultation bilaterally, no wheezes, rales or rhonchi, good air movement CV: HRRR, no clubbing cyanosis or  peripheral edema nl cap refill  MS: moves all extremities without noticeable focal  abnormality PSYCH: pleasant and cooperative, no obvious depression or anxiety Lab Results  Component Value Date   WBC 7.4 11/15/2015   HGB 14.9 11/15/2015   HCT 43.9 11/15/2015   PLT 271.0 11/15/2015   GLUCOSE 112 (H) 11/15/2015   CHOL 138 03/16/2016   TRIG 120.0 03/16/2016   HDL 42.30 03/16/2016   LDLDIRECT 106.0 11/15/2015   LDLCALC 72 03/16/2016   ALT 25 11/15/2015   AST 25 11/15/2015   NA 140 11/15/2015   K 4.4 11/15/2015   CL 105 11/15/2015   CREATININE 1.07 11/15/2015   BUN 20 11/15/2015   CO2 26 11/15/2015   TSH 2.96 11/15/2015   PSA 0.32 11/15/2015   HGBA1C 5.8  03/16/2016   Wt Readings from Last 3 Encounters:  03/20/16 217 lb 12.8 oz (98.8 kg)  02/10/16 226 lb 3.2 oz (102.6 kg)  12/08/15 224 lb 9.6 oz (101.9 kg)    ASSESSMENT AND PLAN:  Discussed the following assessment and plan:  HLD (hyperlipidemia) - improved lsi and same dose of  simva  hdl better  Fasting hyperglycemia - not risk frange better   White coat hypertension - even basel;ine bp is better  Fu wellenss check next Encouraged to continue as doing much better  -Patient advised to return or notify health care team  if symptoms worsen ,persist or new concerns arise.  Patient Instructions  Continue lifestyle intervention healthy eating and exercise . Heart healthy .     Blood tests are good.   See  You at  Your next check up . April   Selene Peltzer K. Yaresly Menzel M.D.

## 2016-03-20 ENCOUNTER — Encounter: Payer: Self-pay | Admitting: Internal Medicine

## 2016-03-20 ENCOUNTER — Ambulatory Visit (INDEPENDENT_AMBULATORY_CARE_PROVIDER_SITE_OTHER): Payer: BLUE CROSS/BLUE SHIELD | Admitting: Internal Medicine

## 2016-03-20 VITALS — BP 134/80 | Temp 98.2°F | Wt 217.8 lb

## 2016-03-20 DIAGNOSIS — R030 Elevated blood-pressure reading, without diagnosis of hypertension: Secondary | ICD-10-CM | POA: Diagnosis not present

## 2016-03-20 DIAGNOSIS — IMO0001 Reserved for inherently not codable concepts without codable children: Secondary | ICD-10-CM

## 2016-03-20 DIAGNOSIS — R7301 Impaired fasting glucose: Secondary | ICD-10-CM | POA: Diagnosis not present

## 2016-03-20 DIAGNOSIS — E785 Hyperlipidemia, unspecified: Secondary | ICD-10-CM | POA: Diagnosis not present

## 2016-03-20 NOTE — Patient Instructions (Addendum)
Continue lifestyle intervention healthy eating and exercise . Heart healthy .     Blood tests are good.   See  You at  Your next check up . April

## 2016-03-27 ENCOUNTER — Other Ambulatory Visit: Payer: Self-pay | Admitting: Internal Medicine

## 2016-03-29 NOTE — Telephone Encounter (Signed)
Sent to the pharmacy by e-scribe for 9 months.  Pt has upcoming cpx on 11/21/16.

## 2016-05-17 DIAGNOSIS — H5213 Myopia, bilateral: Secondary | ICD-10-CM | POA: Diagnosis not present

## 2016-06-15 ENCOUNTER — Ambulatory Visit (INDEPENDENT_AMBULATORY_CARE_PROVIDER_SITE_OTHER): Payer: BLUE CROSS/BLUE SHIELD

## 2016-06-15 DIAGNOSIS — Z23 Encounter for immunization: Secondary | ICD-10-CM

## 2016-06-15 NOTE — Progress Notes (Signed)
Pre visit review using our clinic review tool, if applicable. No additional management support is needed unless otherwise documented below in the visit note. 

## 2016-10-04 DIAGNOSIS — L570 Actinic keratosis: Secondary | ICD-10-CM | POA: Diagnosis not present

## 2016-10-04 DIAGNOSIS — L821 Other seborrheic keratosis: Secondary | ICD-10-CM | POA: Diagnosis not present

## 2016-10-04 DIAGNOSIS — D1801 Hemangioma of skin and subcutaneous tissue: Secondary | ICD-10-CM | POA: Diagnosis not present

## 2016-10-04 DIAGNOSIS — L718 Other rosacea: Secondary | ICD-10-CM | POA: Diagnosis not present

## 2016-10-04 DIAGNOSIS — L814 Other melanin hyperpigmentation: Secondary | ICD-10-CM | POA: Diagnosis not present

## 2016-10-04 DIAGNOSIS — L439 Lichen planus, unspecified: Secondary | ICD-10-CM | POA: Diagnosis not present

## 2016-11-13 ENCOUNTER — Other Ambulatory Visit: Payer: Self-pay | Admitting: Family Medicine

## 2016-11-13 DIAGNOSIS — R739 Hyperglycemia, unspecified: Secondary | ICD-10-CM

## 2016-11-13 DIAGNOSIS — E785 Hyperlipidemia, unspecified: Secondary | ICD-10-CM

## 2016-11-13 DIAGNOSIS — E786 Lipoprotein deficiency: Secondary | ICD-10-CM

## 2016-11-13 DIAGNOSIS — I10 Essential (primary) hypertension: Secondary | ICD-10-CM

## 2016-11-14 ENCOUNTER — Other Ambulatory Visit (INDEPENDENT_AMBULATORY_CARE_PROVIDER_SITE_OTHER): Payer: BLUE CROSS/BLUE SHIELD

## 2016-11-14 DIAGNOSIS — E785 Hyperlipidemia, unspecified: Secondary | ICD-10-CM

## 2016-11-14 DIAGNOSIS — R739 Hyperglycemia, unspecified: Secondary | ICD-10-CM | POA: Diagnosis not present

## 2016-11-14 DIAGNOSIS — I10 Essential (primary) hypertension: Secondary | ICD-10-CM

## 2016-11-14 LAB — BASIC METABOLIC PANEL
BUN: 16 mg/dL (ref 6–23)
CALCIUM: 9.5 mg/dL (ref 8.4–10.5)
CO2: 30 mEq/L (ref 19–32)
Chloride: 105 mEq/L (ref 96–112)
Creatinine, Ser: 1.12 mg/dL (ref 0.40–1.50)
GFR: 71.04 mL/min (ref >60.00)
Glucose, Bld: 110 mg/dL — ABNORMAL HIGH (ref 70–99)
Potassium: 4.7 mEq/L (ref 3.5–5.1)
Sodium: 142 mEq/L (ref 135–145)

## 2016-11-14 LAB — LIPID PANEL
CHOL/HDL RATIO: 4
CHOLESTEROL: 136 mg/dL (ref 0–200)
HDL: 36.8 mg/dL — AB (ref >39.00)
LDL Cholesterol: 63 mg/dL (ref 0–99)
NonHDL: 99.15
Triglycerides: 179 mg/dL — ABNORMAL HIGH (ref 0.0–149.0)
VLDL: 35.8 mg/dL (ref 0.0–40.0)

## 2016-11-14 LAB — HEPATIC FUNCTION PANEL
ALK PHOS: 65 U/L (ref 39–117)
ALT: 19 U/L (ref 0–53)
AST: 21 U/L (ref 0–37)
Albumin: 4.4 g/dL (ref 3.5–5.2)
BILIRUBIN TOTAL: 1.2 mg/dL (ref 0.2–1.2)
Bilirubin, Direct: 0.2 mg/dL (ref 0.0–0.3)
Total Protein: 7.2 g/dL (ref 6.0–8.3)

## 2016-11-14 LAB — CBC WITH DIFFERENTIAL/PLATELET
BASOS ABS: 0 10*3/uL (ref 0.0–0.1)
Basophils Relative: 0.4 % (ref 0.0–3.0)
EOS ABS: 0.1 10*3/uL (ref 0.0–0.7)
Eosinophils Relative: 1.7 % (ref 0.0–5.0)
HEMATOCRIT: 43.5 % (ref 39.0–52.0)
Hemoglobin: 14.6 g/dL (ref 13.0–17.0)
LYMPHS PCT: 22.2 % (ref 12.0–46.0)
Lymphs Abs: 1.4 10*3/uL (ref 0.7–4.0)
MCHC: 33.6 g/dL (ref 30.0–36.0)
MCV: 92.7 fl (ref 78.0–100.0)
Monocytes Absolute: 0.6 10*3/uL (ref 0.1–1.0)
Monocytes Relative: 9.3 % (ref 3.0–12.0)
NEUTROS ABS: 4.1 10*3/uL (ref 1.4–7.7)
Neutrophils Relative %: 66.4 % (ref 43.0–77.0)
PLATELETS: 256 10*3/uL (ref 150.0–400.0)
RBC: 4.7 Mil/uL (ref 4.22–5.81)
RDW: 13.1 % (ref 11.5–15.5)
WBC: 6.2 10*3/uL (ref 4.0–10.5)

## 2016-11-14 LAB — HEMOGLOBIN A1C: HEMOGLOBIN A1C: 5.8 % (ref 4.6–6.5)

## 2016-11-14 LAB — TSH: TSH: 3.96 u[IU]/mL (ref 0.35–4.50)

## 2016-11-20 NOTE — Progress Notes (Signed)
Chief Complaint  Patient presents with  . Annual Exam    HPI: Patient  Carlos Gaines  61 y.o. 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  No new meds  No gu sx  Gi sx   bp is good at home   Not checked recently  Health Maintenance  Topic Date Due  . COLONOSCOPY  07/31/2016  . Hepatitis C Screening  11/21/2017 (Originally Feb 06, 1956)  . HIV Screening  11/21/2017 (Originally [birth year]1971)  . INFLUENZA VACCINE  02/28/2017  . TETANUS/TDAP  11/18/2023   Health Maintenance Review LIFESTYLE:  Exercise:   Shot put and  Weight  lifintng  Walking  Tobacco/ETS: no   Alcohol:  5 per week  Beer  Sugar beverages: reduced .  ocass    To none  Sleep: 7.5-8 Drug use: no HH of  2 Work: 40     ROS:  GEN/ HEENT: No fever, significant weight changes sweats headaches vision problems hearing changes, CV/ PULM; No chest pain shortness of breath cough, syncope,edema  change in exercise tolerance. GI /GU: No adominal pain, vomiting, change in bowel habits. No blood in the stool. No significant GU symptoms. SKIN/HEME: ,no acute skin rashes suspicious lesions or bleeding. No lymphadenopathy, nodules, masses.  NEURO/ PSYCH:  No neurologic signs such as weakness numbness. No depression anxiety. IMM/ Allergy: No unusual infections.  Allergy .   REST of 12 system review negative except as per HPI   Past Medical History:  Diagnosis Date  . Asthma    hx allergy shots in [place]   . Basal cell carcinoma, lip    albertinei resection 2017  . Chicken pox   . GERD (gastroesophageal reflux disease)    endo neg x 2? on  chornnic ppi  . H/O cardiac catheterization    neg wu for sob  in ed   . Heart murmur    cardiac cath neg for sob   . Hyperlipidemia    orig  270 ranges  on meds for 10 years   . Hypertension    white coat   . Nephrolithiasis, uric acid    herrick no hx of gout  fam hx  of gout  . UTI (lower urinary tract infection)    x 1 with fever     Past Surgical History:  Procedure Laterality Date  . CARDIAC CATHETERIZATION    . shoulder repair     2009  . TONSILLECTOMY     1963    Family History  Problem Relation Age of Onset  . Arthritis Father   . Hyperlipidemia Father   . Aneurysm Father   . Graves' disease Sister   neg fam colon cancer prostate cancer   Social History   Social History  . Marital status: Married    Spouse name: N/A  . Number of children: N/A  . Years of education: N/A   Social History Main Topics  . Smoking status: Never Smoker  . Smokeless tobacco: Never Used  . Alcohol use Yes     Comment: ocassionally   . Drug use: No  . Sexual activity: Not Asked   Other Topics Concern  . None   Social History Narrative   7 hours of sleep per night   hh of 2    Patient lives with his wife.   Carlos Gaines:  Moved from [place].    Job Conservation officer, historic buildings ;. 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 albuterol (PROVENTIL HFA;VENTOLIN HFA) 108 (90 Base) MCG/ACT inhaler Inhale 2 puffs into the lungs every 6 (six) hours as needed for wheezing or shortness of breath. Generic albuterol HFA please 1 Inhaler 0  . simvastatin (ZOCOR) 40 MG tablet TAKE 1 BY MOUTH EVERY EVENING 90 tablet 2  . omeprazole (PRILOSEC) 20 MG capsule TAKE 1 BY MOUTH DAILY 90 capsule 0   No facility-administered medications prior to visit.      EXAM:  BP 139/80 (BP Location: Right Arm, Patient Position: Sitting, Cuff Size: Normal)   Pulse 84   Temp 98.4 F (36.9 C) (Oral)   Ht 5' 9.5" (1.765 m)   Wt 215 lb 9.6 oz (97.8 kg)   BMI 31.38 kg/m   Body mass index is 31.38 kg/m. Wt Readings from Last 3 Encounters:  11/21/16 215 lb 9.6 oz (97.8 kg)  03/20/16 217 lb 12.8 oz (98.8 kg)    02/10/16 226 lb 3.2 oz (102.6 kg)    Physical Exam: Vital signs reviewed LOV:FIEP is a well-developed well-nourished alert cooperative    who appearsr stated age in no acute distress.   Good muscle mass  HEENT: normocephalic atraumatic , Eyes: PERRL EOM's full, conjunctiva clear, Nares: paten,t no deformity discharge or tenderness., Ears: no deformity EAC's clear TMs with normal landmarks. Mouth: clear OP, no lesions, edema.  Moist mucous membranes. Dentition in adequate repair. NECK: supple without masses, thyromegaly or bruits. CHEST/PULM:  Clear to auscultation and percussion breath sounds equal no wheeze , rales or rhonchi. No chest wall deformities or tenderness. Breast: normal by inspection . No dimpling, discharge, masses, tenderness or discharge . CV: PMI is nondisplaced, S1 S2 no gallops, murmurs, rubs. Peripheral pulses are full without delay.No JVD .  ABDOMEN: Bowel sounds normal nontender  No guard or rebound, no hepato splenomegal no CVA tenderness.  No hernia. Extremtities:  No clubbing cyanosis or edema, no acute joint swelling or redness no focal atrophy NEURO:  Oriented x3, cranial nerves 3-12 appear to be intact, no obvious focal weakness,gait within normal limits no abnormal reflexes or asymmetrical SKIN: No acute rashes normal turgor, color, no bruising or petechiae. Few old stria at upper arms chest PSYCH: Oriented, good eye contact, no obvious depression anxiety, cognition and judgment appear normal. LN: no cervical axillary inguinal adenopathy rectal hemorrhoidal  tag no mass prostate 1+no nodules   Lab Results  Component Value Date   WBC 6.2 11/14/2016   HGB 14.6 11/14/2016   HCT 43.5 11/14/2016   PLT 256.0 11/14/2016   GLUCOSE 110 (H) 11/14/2016   CHOL 136 11/14/2016   TRIG 179.0 (H) 11/14/2016   HDL 36.80 (L) 11/14/2016   LDLDIRECT 106.0 11/15/2015   LDLCALC 63 11/14/2016   ALT 19 11/14/2016   AST 21 11/14/2016   NA 142 11/14/2016   K 4.7 11/14/2016   CL  105 11/14/2016   CREATININE 1.12 11/14/2016   BUN 16 11/14/2016   CO2 30 11/14/2016   TSH 3.96 11/14/2016   PSA 0.32 11/15/2015   HGBA1C 5.8 11/14/2016    BP Readings from Last 3 Encounters:  11/21/16 139/80  03/20/16 134/80  02/10/16 134/84   Wt Readings  from Last 3 Encounters:  11/21/16 215 lb 9.6 oz (97.8 kg)  03/20/16 217 lb 12.8 oz (98.8 kg)  02/10/16 226 lb 3.2 oz (102.6 kg)     Lab results reviewed with patient   ASSESSMENT AND PLAN:  Discussed the following assessment and plan:  Visit for preventive health examination  Medication management  Fasting hyperglycemia - diet cahnges poss  also statin med  aggraavated   Hyperlipidemia, unspecified hyperlipidemia type  Colon cancer screening - Plan: Ambulatory referral to Gastroenterology Continue med benefit more than risk  orig readings in 270 range   Alter diet still and monitor yearly  Counseled.   Will check bp readings  Intermittently to ensure at goal  Patient Care Team: Burnis Medin, MD as PCP - General (Internal Medicine) Hillery Jacks, MD as Referring Physician (Dermatology) Patient Instructions  Continued attention to exercise and diet    Try off the rice.  bready type foods  Will be contacted about   Screening Colonoscopy referral  .  If all is well then   Yearly check up  psa at next visit  .     Health Maintenance, Male A healthy lifestyle and preventive care is important for your health and wellness. Ask your health care provider about what schedule of regular examinations is right for you. What should I know about weight and diet?  Eat a Healthy Diet  Eat plenty of vegetables, fruits, whole grains, low-fat dairy products, and lean protein.  Do not eat a lot of foods high in solid fats, added sugars, or salt. Maintain a Healthy Weight  Regular exercise can help you achieve or maintain a healthy weight. You should:  Do at least 150 minutes of exercise each week. The exercise should  increase your heart rate and make you sweat (moderate-intensity exercise).  Do strength-training exercises at least twice a week. Watch Your Levels of Cholesterol and Blood Lipids  Have your blood tested for lipids and cholesterol every 5 years starting at 60 years of age. If you are at high risk for heart disease, you should start having your blood tested when you are 60 years old. You may need to have your cholesterol levels checked more often if:  Your lipid or cholesterol levels are high.  You are older than 60 years of age.  You are at high risk for heart disease. What should I know about cancer screening? Many types of cancers can be detected early and may often be prevented. Lung Cancer  You should be screened every year for lung cancer if:  You are a current smoker who has smoked for at least 30 years.  You are a former smoker who has quit within the past 15 years.  Talk to your health care provider about your screening options, when you should start screening, and how often you should be screened. Colorectal Cancer  Routine colorectal cancer screening usually begins at 60 years of age and should be repeated every 5-10 years until you are 60 years old. You may need to be screened more often if early forms of precancerous polyps or small growths are found. Your health care provider may recommend screening at an earlier age if you have risk factors for colon cancer.  Your health care provider may recommend using home test kits to check for hidden blood in the stool.  A small camera at the end of a tube can be used to examine your colon (sigmoidoscopy or colonoscopy). This checks for the  earliest forms of colorectal cancer. Prostate and Testicular Cancer  Depending on your age and overall health, your health care provider may do certain tests to screen for prostate and testicular cancer.  Talk to your health care provider about any symptoms or concerns you have about testicular  or prostate cancer. Skin Cancer  Check your skin from head to toe regularly.  Tell your health care provider about any new moles or changes in moles, especially if:  There is a change in a mole's size, shape, or color.  You have a mole that is larger than a pencil eraser.  Always use sunscreen. Apply sunscreen liberally and repeat throughout the day.  Protect yourself by wearing long sleeves, pants, a wide-brimmed hat, and sunglasses when outside. What should I know about heart disease, diabetes, and high blood pressure?  If you are 15-1 years of age, have your blood pressure checked every 3-5 years. If you are 81 years of age or older, have your blood pressure checked every year. You should have your blood pressure measured twice-once when you are at a hospital or clinic, and once when you are not at a hospital or clinic. Record the average of the two measurements. To check your blood pressure when you are not at a hospital or clinic, you can use:  An automated blood pressure machine at a pharmacy.  A home blood pressure monitor.  Talk to your health care provider about your target blood pressure.  If you are between 63-62 years old, ask your health care provider if you should take aspirin to prevent heart disease.  Have regular diabetes screenings by checking your fasting blood sugar level.  If you are at a normal weight and have a low risk for diabetes, have this test once every three years after the age of 37.  If you are overweight and have a high risk for diabetes, consider being tested at a younger age or more often.  A one-time screening for abdominal aortic aneurysm (AAA) by ultrasound is recommended for men aged 60-75 years who are current or former smokers. What should I know about preventing infection? Hepatitis B  If you have a higher risk for hepatitis B, you should be screened for this virus. Talk with your health care provider to find out if you are at risk for  hepatitis B infection. Hepatitis C  Blood testing is recommended for:  Everyone born from [birth year] through 1964.  Anyone with known risk factors for hepatitis C. Sexually Transmitted Diseases (STDs)  You should be screened each year for STDs including gonorrhea and chlamydia if:  You are sexually active and are younger than 60 years of age.  You are older than 60 years of age and your health care provider tells you that you are at risk for this type of infection.  Your sexual activity has changed since you were last screened and you are at an increased risk for chlamydia or gonorrhea. Ask your health care provider if you are at risk.  Talk with your health care provider about whether you are at high risk of being infected with HIV. Your health care provider may recommend a prescription medicine to help prevent HIV infection. What else can I do?  Schedule regular health, dental, and eye exams.  Stay current with your vaccines (immunizations).  Do not use any tobacco products, such as cigarettes, chewing tobacco, and e-cigarettes. If you need help quitting, ask your health care provider.  Limit alcohol intake to no  more than 2 drinks per day. One drink equals 12 ounces of beer, 5 ounces of wine, or 1 ounces of hard liquor.  Do not use street drugs.  Do not share needles.  Ask your health care provider for help if you need support or information about quitting drugs.  Tell your health care provider if you often feel depressed.  Tell your health care provider if you have ever been abused or do not feel safe at home. This information is not intended to replace advice given to you by your health care provider. Make sure you discuss any questions you have with your health care provider. Document Released: 01/13/2008 Document Revised: 03/15/2016 Document Reviewed: 04/20/2015 Elsevier Interactive Patient Education  2017 [employer]. Lakasha Mcfall M.D.

## 2016-11-21 ENCOUNTER — Encounter: Payer: Self-pay | Admitting: Internal Medicine

## 2016-11-21 ENCOUNTER — Ambulatory Visit (INDEPENDENT_AMBULATORY_CARE_PROVIDER_SITE_OTHER): Payer: BLUE CROSS/BLUE SHIELD | Admitting: Internal Medicine

## 2016-11-21 VITALS — BP 139/80 | HR 84 | Temp 98.4°F | Ht 69.5 in | Wt 215.6 lb

## 2016-11-21 DIAGNOSIS — E785 Hyperlipidemia, unspecified: Secondary | ICD-10-CM

## 2016-11-21 DIAGNOSIS — Z79899 Other long term (current) drug therapy: Secondary | ICD-10-CM | POA: Diagnosis not present

## 2016-11-21 DIAGNOSIS — Z0000 Encounter for general adult medical examination without abnormal findings: Secondary | ICD-10-CM | POA: Diagnosis not present

## 2016-11-21 DIAGNOSIS — R7301 Impaired fasting glucose: Secondary | ICD-10-CM

## 2016-11-21 DIAGNOSIS — Z1211 Encounter for screening for malignant neoplasm of colon: Secondary | ICD-10-CM

## 2016-11-21 NOTE — Patient Instructions (Addendum)
Continued attention to exercise and diet    Try off the rice.  bready type foods  Will be contacted about   Screening Colonoscopy referral  .  If all is well then   Yearly check up  psa at next visit  .     Health Maintenance, Male A healthy lifestyle and preventive care is important for your health and wellness. Ask your health care provider about what schedule of regular examinations is right for you. What should I know about weight and diet?  Eat a Healthy Diet  Eat plenty of vegetables, fruits, whole grains, low-fat dairy products, and lean protein.  Do not eat a lot of foods high in solid fats, added sugars, or salt. Maintain a Healthy Weight  Regular exercise can help you achieve or maintain a healthy weight. You should:  Do at least 150 minutes of exercise each week. The exercise should increase your heart rate and make you sweat (moderate-intensity exercise).  Do strength-training exercises at least twice a week. Watch Your Levels of Cholesterol and Blood Lipids  Have your blood tested for lipids and cholesterol every 5 years starting at 60 years of age. If you are at high risk for heart disease, you should start having your blood tested when you are 60 years old. You may need to have your cholesterol levels checked more often if:  Your lipid or cholesterol levels are high.  You are older than 60 years of age.  You are at high risk for heart disease. What should I know about cancer screening? Many types of cancers can be detected early and may often be prevented. Lung Cancer  You should be screened every year for lung cancer if:  You are a current smoker who has smoked for at least 30 years.  You are a former smoker who has quit within the past 15 years.  Talk to your health care provider about your screening options, when you should start screening, and how often you should be screened. Colorectal Cancer  Routine colorectal cancer screening usually begins at 60  years of age and should be repeated every 5-10 years until you are 60 years old. You may need to be screened more often if early forms of precancerous polyps or small growths are found. Your health care provider may recommend screening at an earlier age if you have risk factors for colon cancer.  Your health care provider may recommend using home test kits to check for hidden blood in the stool.  A small camera at the end of a tube can be used to examine your colon (sigmoidoscopy or colonoscopy). This checks for the earliest forms of colorectal cancer. Prostate and Testicular Cancer  Depending on your age and overall health, your health care provider may do certain tests to screen for prostate and testicular cancer.  Talk to your health care provider about any symptoms or concerns you have about testicular or prostate cancer. Skin Cancer  Check your skin from head to toe regularly.  Tell your health care provider about any new moles or changes in moles, especially if:  There is a change in a mole's size, shape, or color.  You have a mole that is larger than a pencil eraser.  Always use sunscreen. Apply sunscreen liberally and repeat throughout the day.  Protect yourself by wearing long sleeves, pants, a wide-brimmed hat, and sunglasses when outside. What should I know about heart disease, diabetes, and high blood pressure?  If you are 18-39  years of age, have your blood pressure checked every 3-5 years. If you are 17 years of age or older, have your blood pressure checked every year. You should have your blood pressure measured twice-once when you are at a hospital or clinic, and once when you are not at a hospital or clinic. Record the average of the two measurements. To check your blood pressure when you are not at a hospital or clinic, you can use:  An automated blood pressure machine at a pharmacy.  A home blood pressure monitor.  Talk to your health care provider about your target  blood pressure.  If you are between 15-63 years old, ask your health care provider if you should take aspirin to prevent heart disease.  Have regular diabetes screenings by checking your fasting blood sugar level.  If you are at a normal weight and have a low risk for diabetes, have this test once every three years after the age of 73.  If you are overweight and have a high risk for diabetes, consider being tested at a younger age or more often.  A one-time screening for abdominal aortic aneurysm (AAA) by ultrasound is recommended for men aged 64-75 years who are current or former smokers. What should I know about preventing infection? Hepatitis B  If you have a higher risk for hepatitis B, you should be screened for this virus. Talk with your health care provider to find out if you are at risk for hepatitis B infection. Hepatitis C  Blood testing is recommended for:  Everyone born from [birth year] through 1964.  Anyone with known risk factors for hepatitis C. Sexually Transmitted Diseases (STDs)  You should be screened each year for STDs including gonorrhea and chlamydia if:  You are sexually active and are younger than 60 years of age.  You are older than 60 years of age and your health care provider tells you that you are at risk for this type of infection.  Your sexual activity has changed since you were last screened and you are at an increased risk for chlamydia or gonorrhea. Ask your health care provider if you are at risk.  Talk with your health care provider about whether you are at high risk of being infected with HIV. Your health care provider may recommend a prescription medicine to help prevent HIV infection. What else can I do?  Schedule regular health, dental, and eye exams.  Stay current with your vaccines (immunizations).  Do not use any tobacco products, such as cigarettes, chewing tobacco, and e-cigarettes. If you need help quitting, ask your health care  provider.  Limit alcohol intake to no more than 2 drinks per day. One drink equals 12 ounces of beer, 5 ounces of wine, or 1 ounces of hard liquor.  Do not use street drugs.  Do not share needles.  Ask your health care provider for help if you need support or information about quitting drugs.  Tell your health care provider if you often feel depressed.  Tell your health care provider if you have ever been abused or do not feel safe at home. This information is not intended to replace advice given to you by your health care provider. Make sure you discuss any questions you have with your health care provider. Document Released: 01/13/2008 Document Revised: 03/15/2016 Document Reviewed: 04/20/2015 Elsevier Interactive Patient Education  2017 [employer].

## 2017-01-02 ENCOUNTER — Ambulatory Visit (AMBULATORY_SURGERY_CENTER): Payer: Self-pay

## 2017-01-02 VITALS — Ht 69.5 in | Wt 221.6 lb

## 2017-01-02 DIAGNOSIS — Z1211 Encounter for screening for malignant neoplasm of colon: Secondary | ICD-10-CM

## 2017-01-02 MED ORDER — NA SULFATE-K SULFATE-MG SULF 17.5-3.13-1.6 GM/177ML PO SOLN: ORAL | 0 refills | Status: DC

## 2017-01-02 NOTE — Progress Notes (Signed)
Per pt, no allergies to soy or egg products.Pt not taking any weight loss meds or using  O2 at home.   Pt refused Emmi video. 

## 2017-01-03 ENCOUNTER — Encounter: Payer: Self-pay | Admitting: Internal Medicine

## 2017-01-15 ENCOUNTER — Other Ambulatory Visit: Payer: Self-pay | Admitting: Internal Medicine

## 2017-01-16 ENCOUNTER — Encounter: Payer: Self-pay | Admitting: Internal Medicine

## 2017-01-16 ENCOUNTER — Ambulatory Visit (AMBULATORY_SURGERY_CENTER): Payer: BLUE CROSS/BLUE SHIELD | Admitting: Internal Medicine

## 2017-01-16 VITALS — BP 129/72 | HR 61 | Temp 97.8°F | Resp 14 | Ht 69.5 in | Wt 221.0 lb

## 2017-01-16 DIAGNOSIS — Z1211 Encounter for screening for malignant neoplasm of colon: Secondary | ICD-10-CM | POA: Diagnosis not present

## 2017-01-16 DIAGNOSIS — Z1212 Encounter for screening for malignant neoplasm of rectum: Secondary | ICD-10-CM | POA: Diagnosis not present

## 2017-01-16 MED ORDER — SODIUM CHLORIDE 0.9 % IV SOLN: 500.0000 mL | INTRAVENOUS | Status: AC

## 2017-01-16 NOTE — Op Note (Signed)
[place] Patient Name: Jemel Ono Procedure Date: 01/16/2017 7:57 AM MRN: [id number] Endoscopist: Jerene Bears , MD Age: 61 Referring MD:  Date of Birth: 1956/02/03 Gender: Male Account #: [id number] Procedure:                Colonoscopy Indications:              Screening for colorectal malignant neoplasm, Last                            colonoscopy 10 years ago Medicines:                Monitored Anesthesia Care Procedure:                Pre-Anesthesia Assessment:                           - Prior to the procedure, a History and Physical                            was performed, and patient medications and                            allergies were reviewed. The patient's tolerance of                            previous anesthesia was also reviewed. The risks                            and benefits of the procedure and the sedation                            options and risks were discussed with the patient.                            All questions were answered, and informed consent                            was obtained. Prior Anticoagulants: The patient has                            taken no previous anticoagulant or antiplatelet                            agents. ASA Grade Assessment: II - A patient with                            mild systemic disease. After reviewing the risks                            and benefits, the patient was deemed in                            satisfactory condition to undergo the procedure.  After obtaining informed consent, the colonoscope                            was passed under direct vision. Throughout the                            procedure, the patient's blood pressure, pulse, and                            oxygen saturations were monitored continuously. The                            Model CF-HQ190L [id number]) scope was introduced                            through the anus and advanced to the the  cecum,                            identified by appendiceal orifice and ileocecal                            valve. The colonoscopy was performed without                            difficulty. The patient tolerated the procedure                            well. The quality of the bowel preparation was                            good. The ileocecal valve, appendiceal orifice, and                            rectum were photographed. Scope In: 8:09:59 AM Scope Out: 8:21:21 AM Scope Withdrawal Time: 0 hours 7 minutes 3 seconds  Total Procedure Duration: 0 hours 11 minutes 22 seconds  Findings:                 The digital rectal exam was normal.                           Multiple small and large-mouthed diverticula were                            found in the sigmoid colon, descending colon,                            ascending colon and cecum. Erythema was seen in                            association with the diverticular opening.                           Non-bleeding internal hemorrhoids were found during  retroflexion. The hemorrhoids were medium-sized.                           The exam was otherwise without abnormality. Complications:            No immediate complications. Estimated Blood Loss:     Estimated blood loss: none. Impression:               - Moderate diverticulosis in the sigmoid colon, in                            the descending colon, in the ascending colon and in                            the cecum. Erythema was seen in association with                            the diverticular opening.                           - Non-bleeding internal hemorrhoids.                           - The examination was otherwise normal.                           - No specimens collected. Recommendation:           - Patient has a contact number available for                            emergencies. The signs and symptoms of potential                             delayed complications were discussed with the                            patient. Return to normal activities tomorrow.                            Written discharge instructions were provided to the                            patient.                           - Resume previous diet.                           - Continue present medications.                           - Repeat colonoscopy in 10 years for screening                            purposes. Jerene Bears, MD 01/16/2017 8:23:34 AM This report has been signed electronically.

## 2017-01-16 NOTE — Progress Notes (Signed)
Pt's states no medical or surgical changes since previsit or office visit. 

## 2017-01-16 NOTE — Progress Notes (Signed)
Alert and oriented x3, pleased with MAC, report to RN Suzanne 

## 2017-01-16 NOTE — Patient Instructions (Signed)
YOU HAD AN ENDOSCOPIC PROCEDURE TODAY AT [facility]:   Refer to the procedure report that was given to you for any specific questions about what was found during the examination.  If the procedure report does not answer your questions, please call your gastroenterologist to clarify.  If you requested that your care partner not be given the details of your procedure findings, then the procedure report has been included in a sealed envelope for you to review at your convenience later.  YOU SHOULD EXPECT: Some feelings of bloating in the abdomen. Passage of more gas than usual.  Walking can help get rid of the air that was put into your GI tract during the procedure and reduce the bloating. If you had a lower endoscopy (such as a colonoscopy or flexible sigmoidoscopy) you may notice spotting of blood in your stool or on the toilet paper. If you underwent a bowel prep for your procedure, you may not have a normal bowel movement for a few days.  Please Note:  You might notice some irritation and congestion in your nose or some drainage.  This is from the oxygen used during your procedure.  There is no need for concern and it should clear up in a day or so.  SYMPTOMS TO REPORT IMMEDIATELY:   Following lower endoscopy (colonoscopy or flexible sigmoidoscopy):  Excessive amounts of blood in the stool  Significant tenderness or worsening of abdominal pains  Swelling of the abdomen that is new, acute  Fever of 100F or higher   For urgent or emergent issues, a gastroenterologist can be reached at any hour by calling [phone].   DIET:  We do recommend a small meal at first, but then you may proceed to your regular diet.  Drink plenty of fluids but you should avoid alcoholic beverages for 24 hours. We suggest a high fiber diet, and drink lots of water.  ACTIVITY:  You should plan to take it easy for the rest of today and you should NOT DRIVE or use heavy machinery until tomorrow  (because of the sedation medicines used during the test).    FOLLOW UP: Our staff will call the number listed on your records the next business day following your procedure to check on you and address any questions or concerns that you may have regarding the information given to you following your procedure. If we do not reach you, we will leave a message.  However, if you are feeling well and you are not experiencing any problems, there is no need to return our call.  We will assume that you have returned to your regular daily activities without incident.  If any biopsies were taken you will be contacted by phone or by letter within the next 1-3 weeks.  Please call us at [phone] if you have not heard about the biopsies in 3 weeks.    SIGNATURES/CONFIDENTIALITY: You and/or your care partner have signed paperwork which will be entered into your electronic medical record.  These signatures attest to the fact that that the information above on your After Visit Summary has been reviewed and is understood.  Full responsibility of the confidentiality of this discharge information lies with you and/or your care-partner.  You will need a repeat colonoscopy in 10 years.

## 2017-01-17 ENCOUNTER — Telehealth: Payer: Self-pay | Admitting: *Deleted

## 2017-01-17 NOTE — Telephone Encounter (Signed)
No answer will attempt to call back later this afternoon. SM

## 2017-01-17 NOTE — Telephone Encounter (Signed)
  Follow up Call-  Call back number 01/16/2017  Post procedure Call Back phone  # [phone]  Permission to leave phone message Yes  Some recent data might be hidden     Patient questions:  Do you have a fever, pain , or abdominal swelling? No. Pain Score  0 *  Have you tolerated food without any problems? Yes.    Have you been able to return to your normal activities? Yes.    Do you have any questions about your discharge instructions: Diet   No. Medications  No. Follow up visit  No.  Do you have questions or concerns about your Care? No.  Actions: * If pain score is 4 or above: No action needed, pain <4.

## 2017-03-05 DIAGNOSIS — H43812 Vitreous degeneration, left eye: Secondary | ICD-10-CM | POA: Diagnosis not present

## 2017-04-03 DIAGNOSIS — H43812 Vitreous degeneration, left eye: Secondary | ICD-10-CM | POA: Diagnosis not present

## 2017-04-20 ENCOUNTER — Encounter: Payer: Self-pay | Admitting: Internal Medicine

## 2017-06-12 ENCOUNTER — Ambulatory Visit (INDEPENDENT_AMBULATORY_CARE_PROVIDER_SITE_OTHER): Payer: BLUE CROSS/BLUE SHIELD

## 2017-06-12 DIAGNOSIS — Z23 Encounter for immunization: Secondary | ICD-10-CM

## 2017-08-13 ENCOUNTER — Ambulatory Visit (INDEPENDENT_AMBULATORY_CARE_PROVIDER_SITE_OTHER): Payer: BLUE CROSS/BLUE SHIELD | Admitting: Family Medicine

## 2017-08-13 DIAGNOSIS — Z23 Encounter for immunization: Secondary | ICD-10-CM

## 2017-09-17 ENCOUNTER — Encounter: Payer: Self-pay | Admitting: Internal Medicine

## 2017-09-17 ENCOUNTER — Ambulatory Visit (INDEPENDENT_AMBULATORY_CARE_PROVIDER_SITE_OTHER): Payer: BLUE CROSS/BLUE SHIELD | Admitting: Internal Medicine

## 2017-09-17 VITALS — BP 122/60 | HR 84 | Temp 98.3°F | Wt 222.0 lb

## 2017-09-17 DIAGNOSIS — R300 Dysuria: Secondary | ICD-10-CM

## 2017-09-17 DIAGNOSIS — R319 Hematuria, unspecified: Secondary | ICD-10-CM

## 2017-09-17 DIAGNOSIS — N390 Urinary tract infection, site not specified: Secondary | ICD-10-CM | POA: Diagnosis not present

## 2017-09-17 DIAGNOSIS — N209 Urinary calculus, unspecified: Secondary | ICD-10-CM

## 2017-09-17 LAB — POCT URINALYSIS DIPSTICK
BILIRUBIN UA: NEGATIVE
GLUCOSE UA: NEGATIVE
Ketones, UA: NEGATIVE
Nitrite, UA: NEGATIVE
Spec Grav, UA: >=1.03 — AB (ref 1.010–1.025)
Urobilinogen, UA: 1 E.U./dL
pH, UA: 6 (ref 5.0–8.0)

## 2017-09-17 MED ORDER — CIPROFLOXACIN HCL 500 MG PO TABS
500.0000 mg | ORAL_TABLET | Freq: Two times a day (BID) | ORAL | 0 refills | Status: DC
Start: 2017-09-17 — End: 2017-11-26

## 2017-09-17 NOTE — Progress Notes (Signed)
Chief Complaint  Patient presents with  . Urinary Tract Infection    Burning with urination. Visible blood in urine - urine is dark amber, strong smell and very cloudy. Pt has been running a fever 99-101, denies N/V.     HPI: Carlos Gaines 62 y.o.   sda because of symptoms consistent with a urinary tract infection.     sat am feb 16 like sand in urine .  Noted to be uric acid by analysis in the past and he had noted that his diet was not as good recently with some increased soda and alcohol.  Had urethral pain and sand like sx  And then in the evening developed fever night sweats and shakes and   Burning with urination and noted blood in his urine that is continued since that time. He has increased fluids symptoms continue no flank pain severe abdominal pain obstructive symptoms or vomiting. Increased fluids   .    Fever low grade  .  99 in the day 100 and 1 at night.    Took  advil .   Past hx of uti in past .    One in 1990 and one in 2013  .    When he saw a urologist in the past for the uric acid bladder stone he did not get an imaging study. Has never had to be hospitalized for UTI or stone. His last uric acid was 3.5 last year. Blood pressures been good.   ROS: See pertinent positives and negatives per HPI. No cp sob prostate sx gi change   Past Medical History:  Diagnosis Date  . Asthma    hx allergy shots in [place]   . Basal cell carcinoma, lip    albertinei resection 2017  . Chicken pox   . GERD (gastroesophageal reflux disease)    endo neg x 2? on  chornnic ppi  . H/O cardiac catheterization    neg wu for sob  in ed   . Heart murmur    cardiac cath neg for sob   . Hyperlipidemia    orig  270 ranges  on meds for 10 years   . Hypertension    white coat   . Nephrolithiasis, uric acid    herrick no hx of gout  fam hx of gout  . UTI (lower urinary tract infection)    x 1 with fever     Family History  Problem Relation Age of Onset  . Arthritis Father   .  Hyperlipidemia Father   . Aneurysm Father   . Graves' disease Sister     Social History   Socioeconomic History  . Marital status: Married    Spouse name: None  . Number of children: None  . Years of education: None  . Highest education level: None  Social Needs  . Financial resource strain: None  . Food insecurity - worry: None  . Food insecurity - inability: None  . Transportation needs - medical: None  . Transportation needs - non-medical: None  Occupational History  . None  Tobacco Use  . Smoking status: Never Smoker  . Smokeless tobacco: Never Used  Substance and Sexual Activity  . Alcohol use: Yes    Alcohol/week: 2.4 - 3.0 oz    Types: 4 - 5 Standard drinks or equivalent per week  . Drug use: No  . Sexual activity: None  Other Topics Concern  . None  Social History Narrative   7 hours of  sleep per night   hh of 2    Patient lives with his wife.   [name]:  Moved from [place].    Job Conservation officer, historic buildings ;. 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 albuterol (PROVENTIL HFA;VENTOLIN HFA) 108 (90 Base) MCG/ACT inhaler Inhale 2 puffs into the lungs every 6 (six) hours as needed for wheezing or shortness of breath. Generic albuterol HFA please 1 Inhaler 0  . ranitidine (ZANTAC) 150 MG tablet Take 150 mg by mouth as needed for heartburn.    . simvastatin (ZOCOR) 40 MG tablet TAKE 1 TABLET BY MOUTH EVERY EVENING 90 tablet 2   Facility-Administered Medications Prior to Visit  Medication Dose Route Frequency Provider Last Rate Last Dose  . 0.9 %  sodium chloride infusion  500 mL Intravenous Continuous Pyrtle, Lajuan Lines, MD         EXAM:  BP 122/60 (BP Location: Right Arm, Patient Position: Sitting, Cuff Size: Normal)   Pulse 84   Temp 98.3 F (36.8 C) (Oral)   Wt 222 lb (100.7 kg)   BMI 32.31  kg/m   Body mass index is 32.31 kg/m.  GENERAL: vitals reviewed and listed above, alert, oriented, appears well hydrated and in no acute distress non toxic  HEENT: atraumatic, conjunctiva  clear, no obvious abnormalities on inspection of external nose and ears  NECK: no obvious masses on inspection palpation  CV: HRRR, no clubbing cyanosis or  peripheral edema nl cap refill  Abdomen:  Sof,t normal bowel sounds without hepatosplenomegaly, no guarding rebound or masses no CVA tendernes MS: moves all extremities without noticeable focal  abnormality PSYCH: pleasant and cooperative, no obvious depression or anxiety poct ua po blood and leuk  Lab Results  Component Value Date   CREATININE 1.12 11/14/2016    Lab Results  Component Value Date   LABURIC 3.5 (L) 03/16/2016    ASSESSMENT AND PLAN:  Discussed the following assessment and plan:  Urinary tract infection with fever  Burning with urination - Plan: POC Urinalysis Dipstick, Urine Culture  Urinary tract infection with hematuria, site unspecified  Uric acid stone in urine history  - see notes  will prob   need imaging study   uro when seen in past   no sx  just passing stone Concerning    For soft tissues  Infection with night lye fever 101 and chills   Although no flank pain prostate pain.    But concern for complicated uti with hx of stone Has seen uro in past for uric acid stone passing but no imaging study done and last uric acid level was low.     Expectant management.  Risk benefit of medication discussed. Of quinolones but I think the best rx for today to start.   ( has used in  rmeote past)   Fu 7-10 days  Or earlier if not improved cx pending   Close observation advised  And fu planned  -Patient advised to return or notify health care team  if symptoms worsen ,persist or new concerns arise.  Patient Instructions  Take antibiotic  Fluids   Plan follow up depending on  How you are doing .   Or  At end of medication  7-10 days  Expect significant improvement  in the next  48 hours.     Standley Brooking. Jamiesha Victoria M.D.

## 2017-09-17 NOTE — Addendum Note (Signed)
Addended by: Tomi Likens on: 09/17/2017 10:18 AM   Modules accepted: Orders

## 2017-09-17 NOTE — Patient Instructions (Signed)
Take antibiotic  Fluids   Plan follow up depending on  How you are doing .   Or  At end of medication 7-10 days   Expect significant improvement  in the next  48 hours.

## 2017-09-20 LAB — URINE CULTURE
MICRO NUMBER:: 90214003
SPECIMEN QUALITY:: ADEQUATE

## 2017-10-02 ENCOUNTER — Other Ambulatory Visit: Payer: Self-pay | Admitting: Internal Medicine

## 2017-10-03 ENCOUNTER — Other Ambulatory Visit: Payer: Self-pay | Admitting: Internal Medicine

## 2017-11-23 NOTE — Progress Notes (Signed)
Chief Complaint  Patient presents with  . Annual Exam    Pt reports having UTI recently which was treated with Cipro. Pt having some residual GI issues since taking the abx.      HPI: Patient  Carlos Gaines  62 y.o. comes in today for Preventive Health Care visit   Bp doing well.   Losing weight healthy manner .  Sometimes   ibs skinny stools  ocass and getting better .   Last colon 6 2018 and on 10 year recall ,  After antibiotic  Feels gassy and bloated and stools off and on . But getting better   No uti sx and antibiotic helped after 3 days .   Hx of  Seeing uro after passed ua stone and  With this uti had sand like substance and feels related to " partying like a  Merrilyn Puma"  Before onset .   ocass ruq rib cage  Area seems like ms cause .   No vomiting with this   Taking simva  No problem   Gi  Down to   ranitidine and off the ppis   No gu sx   Health Maintenance  Topic Date Due  . Hepatitis C Screening  [birth year]  . HIV Screening  09/[birth year]/1971  . INFLUENZA VACCINE  02/28/2018  . TETANUS/TDAP  11/18/2023  . COLONOSCOPY  01/17/2027   Health Maintenance Review LIFESTYLE:  Exercise:   wegiht train 3 x per week  And 2-4 miles walking .  Tobacco/ETS: no Alcohol:  Down 2-3 per week  Sugar beverages:   totally gone  Sleep: 7 hours  Drug use: no HH of  2 Work: 40   ROS:  GEN/ HEENT: No fever, significant weight changes sweats headaches vision problems hearing changes, CV/ PULM; No chest pain shortness of breath cough, syncope,edema  change in exercise tolerance. GI /GU: No adominal pain, vomiting,  SEE A BOVE . No blood in the stool. No significant GU symptoms. SKIN/HEME: ,no acute skin rashes suspicious lesions or bleeding. No lymphadenopathy, nodules, masses.  NEURO/ PSYCH:  No neurologic signs such as weakness numbness. No depression anxiety. IMM/ Allergy: No unusual infections.  Allergy .   REST of 12 system review negative except as per HPI   Past Medical  History:  Diagnosis Date  . Asthma    hx allergy shots in [place]   . Basal cell carcinoma, lip    albertinei resection 2017  . Chicken pox   . GERD (gastroesophageal reflux disease)    endo neg x 2? on  chornnic ppi  . H/O cardiac catheterization    neg wu for sob  in ed   . Heart murmur    cardiac cath neg for sob   . Hyperlipidemia    orig  270 ranges  on meds for 10 years   . Hypertension    white coat   . Nephrolithiasis, uric acid    herrick no hx of gout  fam hx of gout  . UTI (lower urinary tract infection)    x 1 with fever     Past Surgical History:  Procedure Laterality Date  . CARDIAC CATHETERIZATION  2008  . shoulder repair  2009   2009/ right shoulder  . TONSILLECTOMY  1963   1963    Family History  Problem Relation Age of Onset  . Arthritis Father   . Hyperlipidemia Father   . Aneurysm Father   . Graves' disease Sister  Social History   Socioeconomic History  . Marital status: Married    Spouse name: Not on file  . Number of children: Not on file  . Years of education: Not on file  . Highest education level: Not on file  Occupational History  . Not on file  Social Needs  . Financial resource strain: Not on file  . Food insecurity:    Worry: Not on file    Inability: Not on file  . Transportation needs:    Medical: Not on file    Non-medical: Not on file  Tobacco Use  . Smoking status: Never Smoker  . Smokeless tobacco: Never Used  Substance and Sexual Activity  . Alcohol use: Yes    Alcohol/week: 2.4 - 3.0 oz    Types: 4 - 5 Standard drinks or equivalent per week  . Drug use: No  . Sexual activity: Not on file  Lifestyle  . Physical activity:    Days per week: Not on file    Minutes per session: Not on file  . Stress: Not on file  Relationships  . Social connections:    Talks on phone: Not on file    Gets together: Not on file    Attends religious service: Not on file    Active member of club or organization: Not on file     Attends meetings of clubs or organizations: Not on file    Relationship status: Not on file  Other Topics Concern  . Not on file  Social History Narrative   7 hours of sleep per night   hh of 2    Patient lives with his wife.   [name]:  Moved from [place].    Job Conservation officer, historic buildings ;. 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 ranitidine (ZANTAC) 150 MG tablet Take 75 mg by mouth as needed for heartburn.     . simvastatin (ZOCOR) 40 MG tablet TAKE 1 TABLET BY MOUTH EVERY EVENING 90 tablet 0  . albuterol (PROVENTIL HFA;VENTOLIN HFA) 108 (90 Base) MCG/ACT inhaler Inhale 2 puffs into the lungs every 6 (six) hours as needed for wheezing or shortness of breath. Generic albuterol HFA please (Patient not taking: Reported on 11/26/2017) 1 Inhaler 0  . ciprofloxacin (CIPRO) 500 MG tablet Take 1 tablet (500 mg total) by mouth 2 (two) times daily. (Patient not taking: Reported on 11/26/2017) 20 tablet 0  . simvastatin (ZOCOR) 40 MG tablet TAKE 1 TABLET BY MOUTH EVERY EVENING (Patient not taking: Reported on 11/26/2017) 90 tablet 0   Facility-Administered Medications Prior to Visit  Medication Dose Route Frequency Provider Last Rate Last Dose  . 0.9 %  sodium chloride infusion  500 mL Intravenous Continuous Pyrtle, Lajuan Lines, MD         EXAM:  BP 118/76 (BP Location: Right Arm, Patient Position: Sitting, Cuff Size: Normal)   Pulse 77   Temp 97.9 F (36.6 C) (Oral)   Ht 5' 9.75" (1.772 m)   Wt 208 lb 14.4 oz (94.8 kg)   BMI 30.19 kg/m   Body mass index is 30.19 kg/m. Wt Readings from Last 3 Encounters:  11/26/17 208 lb 14.4 oz (94.8 kg)  09/17/17 222 lb (100.7 kg)  01/16/17 221 lb (100.2 kg)  Physical Exam: Vital signs reviewed [id number] is a well-developed well-nourished alert cooperative    who appearsr stated  age in no acute distress.  HEENT: normocephalic atraumatic , Eyes: PERRL EOM's full, conjunctiva clear, Nares: paten,t no deformity discharge or tenderness., Ears: no deformity EAC's clear TMs with normal landmarks. Mouth: clear OP, no lesions, edema.  Moist mucous membranes. Dentition in adequate repair. NECK: supple without masses, thyromegaly or bruits. CHEST/PULM:  Clear to auscultation and percussion breath sounds equal no wheeze , rales or rhonchi. No chest wall deformities or tenderness. Breast: normal by inspection . No dimpling, discharge, masses, tenderness or discharge . CV: PMI is nondisplaced, S1 S2 no gallops, murmurs, rubs. Peripheral pulses are full without delay.No JVD .  ABDOMEN: Bowel sounds normal nontender  No guard or rebound, no hepato splenomegal no CVA tenderness.  No hernia. Extremtities:  No clubbing cyanosis or edema, no acute joint swelling or redness no focal atrophy NEURO:  Oriented x3, cranial nerves 3-12 appear to be intact, no obvious focal weakness,gait within normal limits no abnormal reflexes or asymmetrical SKIN: No acute rashes normal turgor, color, no bruising or petechiae. PSYCH: Oriented, good eye contact, no obvious depression anxiety, cognition and judgment appear normal. LN: no cervical axillary inguinal adenopathy Prostate  exam rectal  No masses  Prostate  No nodules  Or   Irregularity   Lab Results  Component Value Date   WBC 6.2 11/14/2016   HGB 14.6 11/14/2016   HCT 43.5 11/14/2016   PLT 256.0 11/14/2016   GLUCOSE 110 (H) 11/14/2016   CHOL 136 11/14/2016   TRIG 179.0 (H) 11/14/2016   HDL 36.80 (L) 11/14/2016   LDLDIRECT 106.0 11/15/2015   LDLCALC 63 11/14/2016   ALT 19 11/14/2016   AST 21 11/14/2016   NA 142 11/14/2016   K 4.7 11/14/2016   CL 105 11/14/2016   CREATININE 1.12 11/14/2016   BUN 16 11/14/2016   CO2 30 11/14/2016   TSH 3.96 11/14/2016   PSA 0.32 11/15/2015   HGBA1C 5.8 11/14/2016    BP Readings from Last 3  Encounters:  11/26/17 118/76  09/17/17 122/60  01/16/17 129/72   Wt Readings from Last 3 Encounters:  11/26/17 208 lb 14.4 oz (94.8 kg)  09/17/17 222 lb (100.7 kg)  01/16/17 221 lb (100.2 kg)      ASSESSMENT AND PLAN:  Discussed the following assessment and plan:  Visit for preventive health examination - Plan: Basic metabolic panel, CBC with Differential/Platelet, Hepatic function panel, Lipid panel, Hemoglobin A1c, POCT Urinalysis Dipstick (Automated)  Medication management - Plan: Basic metabolic panel, CBC with Differential/Platelet, Hepatic function panel, Lipid panel, Hemoglobin A1c, POCT Urinalysis Dipstick (Automated)  Fasting hyperglycemia - Plan: Basic metabolic panel, CBC with Differential/Platelet, Hepatic function panel, Lipid panel, Hemoglobin A1c, POCT Urinalysis Dipstick (Automated)  Hyperlipidemia, unspecified hyperlipidemia type - Plan: Basic metabolic panel, CBC with Differential/Platelet, Hepatic function panel, Lipid panel, Hemoglobin A1c, POCT Urinalysis Dipstick (Automated)  GI symptom - Plan: Basic metabolic panel, CBC with Differential/Platelet, Hepatic function panel, Lipid panel, Hemoglobin A1c, POCT Urinalysis Dipstick (Automated), US Renal, US Abdomen Limited RUQ  Screening PSA (prostate specific antigen) - Plan: PSA  RUQ discomfort - Plan: US Renal, US Abdomen Limited RUQ  History of UTI - Plan: Basic metabolic panel, CBC with Differential/Platelet, Hepatic function panel, Lipid panel, Hemoglobin A1c, POCT Urinalysis Dipstick (Automated), US Renal, US Abdomen Limited RUQ  Uric acid stone in urine - Plan: US Renal, US Abdomen Limited RUQ Get renal US no imaging done  in past  When went to uro for stone .    Also  abd [place] for ruq    R/o stones  If all ok then yearly follow up or as appropriate.  Patient Care Team: Franklin Clapsaddle, Standley Brooking, MD as PCP - General (Internal Medicine) Hillery Jacks, MD as Referring Physician (Dermatology) Patient Instructions    Checking  Urine and lab today   You will be contacted about  Ultrasound of  Kidney and   Gallbladder area .   If all is normal  Then we can follow    And if  persistent or progressive sx plan follo up  Sometimes probitoics help sometimes not.   If all ok then  Yearly check up .  If recurrent uti or stome sx then  Will get you back to the urologist  But your exam is reassuring today.     Health Maintenance, Male A healthy lifestyle and preventive care is important for your health and wellness. Ask your health care provider about what schedule of regular examinations is right for you. What should I know about weight and diet? Eat a Healthy Diet  Eat plenty of vegetables, fruits, whole grains, low-fat dairy products, and lean protein.  Do not eat a lot of foods high in solid fats, added sugars, or salt.  Maintain a Healthy Weight Regular exercise can help you achieve or maintain a healthy weight. You should:  Do at least 150 minutes of exercise each week. The exercise should increase your heart rate and make you sweat (moderate-intensity exercise).  Do strength-training exercises at least twice a week.  Watch Your Levels of Cholesterol and Blood Lipids  Have your blood tested for lipids and cholesterol every 5 years starting at 61 years of age. If you are at high risk for heart disease, you should start having your blood tested when you are 61 years old. You may need to have your cholesterol levels checked more often if: ? Your lipid or cholesterol levels are high. ? You are older than 61 years of age. ? You are at high risk for heart disease.  What should I know about cancer screening? Many types of cancers can be detected early and may often be prevented. Lung Cancer  You should be screened every year for lung cancer if: ? You are a current smoker who has smoked for at least 30 years. ? You are a former smoker who has quit within the past 15 years.  Talk to your health care  provider about your screening options, when you should start screening, and how often you should be screened.  Colorectal Cancer  Routine colorectal cancer screening usually begins at 61 years of age and should be repeated every 5-10 years until you are 60 years old. You may need to be screened more often if early forms of precancerous polyps or small growths are found. Your health care provider may recommend screening at an earlier age if you have risk factors for colon cancer.  Your health care provider may recommend using home test kits to check for hidden blood in the stool.  A small camera at the end of a tube can be used to examine your colon (sigmoidoscopy or colonoscopy). This checks for the earliest forms of colorectal cancer.  Prostate and Testicular Cancer  Depending on your age and overall health, your health care provider may do certain tests to screen for prostate and testicular cancer.  Talk to your health care provider about  any symptoms or concerns you have about testicular or prostate cancer.  Skin Cancer  Check your skin from head to toe regularly.  Tell your health care provider about any new moles or changes in moles, especially if: ? There is a change in a mole's size, shape, or color. ? You have a mole that is larger than a pencil eraser.  Always use sunscreen. Apply sunscreen liberally and repeat throughout the day.  Protect yourself by wearing long sleeves, pants, a wide-brimmed hat, and sunglasses when outside.  What should I know about heart disease, diabetes, and high blood pressure?  If you are 48-49 years of age, have your blood pressure checked every 3-5 years. If you are 57 years of age or older, have your blood pressure checked every year. You should have your blood pressure measured twice-once when you are at a hospital or clinic, and once when you are not at a hospital or clinic. Record the average of the two measurements. To check your blood pressure  when you are not at a hospital or clinic, you can use: ? An automated blood pressure machine at a pharmacy. ? A home blood pressure monitor.  Talk to your health care provider about your target blood pressure.  If you are between 38-10 years old, ask your health care provider if you should take aspirin to prevent heart disease.  Have regular diabetes screenings by checking your fasting blood sugar level. ? If you are at a normal weight and have a low risk for diabetes, have this test once every three years after the age of 67. ? If you are overweight and have a high risk for diabetes, consider being tested at a younger age or more often.  A one-time screening for abdominal aortic aneurysm (AAA) by ultrasound is recommended for men aged 24-75 years who are current or former smokers. What should I know about preventing infection? Hepatitis B If you have a higher risk for hepatitis B, you should be screened for this virus. Talk with your health care provider to find out if you are at risk for hepatitis B infection. Hepatitis C Blood testing is recommended for:  Everyone born from [birth year] through 1964.  Anyone with known risk factors for hepatitis C.  Sexually Transmitted Diseases (STDs)  You should be screened each year for STDs including gonorrhea and chlamydia if: ? You are sexually active and are younger than 61 years of age. ? You are older than 61 years of age and your health care provider tells you that you are at risk for this type of infection. ? Your sexual activity has changed since you were last screened and you are at an increased risk for chlamydia or gonorrhea. Ask your health care provider if you are at risk.  Talk with your health care provider about whether you are at high risk of being infected with HIV. Your health care provider may recommend a prescription medicine to help prevent HIV infection.  What else can I do?  Schedule regular health, dental, and eye  exams.  Stay current with your vaccines (immunizations).  Do not use any tobacco products, such as cigarettes, chewing tobacco, and e-cigarettes. If you need help quitting, ask your health care provider.  Limit alcohol intake to no more than 2 drinks per day. One drink equals 12 ounces of beer, 5 ounces of wine, or 1 ounces of hard liquor.  Do not use street drugs.  Do not share needles.  Ask your health  care provider for help if you need support or information about quitting drugs.  Tell your health care provider if you often feel depressed.  Tell your health care provider if you have ever been abused or do not feel safe at home. This information is not intended to replace advice given to you by your health care provider. Make sure you discuss any questions you have with your health care provider. Document Released: 01/13/2008 Document Revised: 03/15/2016 Document Reviewed: 04/20/2015 Elsevier Interactive Patient Education  2018 [place]. Arnika Larzelere M.D.

## 2017-11-26 ENCOUNTER — Ambulatory Visit (INDEPENDENT_AMBULATORY_CARE_PROVIDER_SITE_OTHER): Payer: BLUE CROSS/BLUE SHIELD | Admitting: Internal Medicine

## 2017-11-26 ENCOUNTER — Encounter: Payer: Self-pay | Admitting: Internal Medicine

## 2017-11-26 VITALS — BP 118/76 | HR 77 | Temp 97.9°F | Ht 69.75 in | Wt 208.9 lb

## 2017-11-26 DIAGNOSIS — E785 Hyperlipidemia, unspecified: Secondary | ICD-10-CM | POA: Diagnosis not present

## 2017-11-26 DIAGNOSIS — R1011 Right upper quadrant pain: Secondary | ICD-10-CM

## 2017-11-26 DIAGNOSIS — Z87440 Personal history of urinary (tract) infections: Secondary | ICD-10-CM

## 2017-11-26 DIAGNOSIS — R7301 Impaired fasting glucose: Secondary | ICD-10-CM

## 2017-11-26 DIAGNOSIS — N209 Urinary calculus, unspecified: Secondary | ICD-10-CM | POA: Diagnosis not present

## 2017-11-26 DIAGNOSIS — Z125 Encounter for screening for malignant neoplasm of prostate: Secondary | ICD-10-CM

## 2017-11-26 DIAGNOSIS — Z79899 Other long term (current) drug therapy: Secondary | ICD-10-CM

## 2017-11-26 DIAGNOSIS — R198 Other specified symptoms and signs involving the digestive system and abdomen: Secondary | ICD-10-CM

## 2017-11-26 DIAGNOSIS — Z0000 Encounter for general adult medical examination without abnormal findings: Secondary | ICD-10-CM

## 2017-11-26 LAB — CBC WITH DIFFERENTIAL/PLATELET
BASOS ABS: 0 10*3/uL (ref 0.0–0.1)
Basophils Relative: 0.4 % (ref 0.0–3.0)
EOS ABS: 0.1 10*3/uL (ref 0.0–0.7)
Eosinophils Relative: 1.4 % (ref 0.0–5.0)
HEMATOCRIT: 44.3 % (ref 39.0–52.0)
Hemoglobin: 15.1 g/dL (ref 13.0–17.0)
LYMPHS PCT: 23 % (ref 12.0–46.0)
Lymphs Abs: 1.4 10*3/uL (ref 0.7–4.0)
MCHC: 34.1 g/dL (ref 30.0–36.0)
MCV: 93.1 fl (ref 78.0–100.0)
Monocytes Absolute: 0.7 10*3/uL (ref 0.1–1.0)
Monocytes Relative: 10.4 % (ref 3.0–12.0)
NEUTROS ABS: 4.1 10*3/uL (ref 1.4–7.7)
Neutrophils Relative %: 64.8 % (ref 43.0–77.0)
PLATELETS: 287 10*3/uL (ref 150.0–400.0)
RBC: 4.76 Mil/uL (ref 4.22–5.81)
RDW: 13.6 % (ref 11.5–15.5)
WBC: 6.3 10*3/uL (ref 4.0–10.5)

## 2017-11-26 LAB — LIPID PANEL
CHOL/HDL RATIO: 3
Cholesterol: 127 mg/dL (ref 0–200)
HDL: 44 mg/dL (ref >39.00)
LDL Cholesterol: 67 mg/dL (ref 0–99)
NONHDL: 83.35
Triglycerides: 80 mg/dL (ref 0.0–149.0)
VLDL: 16 mg/dL (ref 0.0–40.0)

## 2017-11-26 LAB — BASIC METABOLIC PANEL
BUN: 27 mg/dL — ABNORMAL HIGH (ref 6–23)
CALCIUM: 9.8 mg/dL (ref 8.4–10.5)
CO2: 27 meq/L (ref 19–32)
CREATININE: 1.11 mg/dL (ref 0.40–1.50)
Chloride: 102 mEq/L (ref 96–112)
GFR: 71.54 mL/min (ref >60.00)
Glucose, Bld: 81 mg/dL (ref 70–99)
Potassium: 4.6 mEq/L (ref 3.5–5.1)
Sodium: 140 mEq/L (ref 135–145)

## 2017-11-26 LAB — POC URINALSYSI DIPSTICK (AUTOMATED)
BILIRUBIN UA: NEGATIVE
GLUCOSE UA: NEGATIVE
Leukocytes, UA: NEGATIVE
Nitrite, UA: NEGATIVE
Urobilinogen, UA: 0.2 E.U./dL
pH, UA: 6 (ref 5.0–8.0)

## 2017-11-26 LAB — HEPATIC FUNCTION PANEL
ALK PHOS: 66 U/L (ref 39–117)
ALT: 20 U/L (ref 0–53)
AST: 29 U/L (ref 0–37)
Albumin: 4.8 g/dL (ref 3.5–5.2)
BILIRUBIN DIRECT: 0.3 mg/dL (ref 0.0–0.3)
TOTAL PROTEIN: 7.8 g/dL (ref 6.0–8.3)
Total Bilirubin: 1.6 mg/dL — ABNORMAL HIGH (ref 0.2–1.2)

## 2017-11-26 LAB — PSA: PSA: 0.32 ng/mL (ref 0.10–4.00)

## 2017-11-26 LAB — HEMOGLOBIN A1C: Hgb A1c MFr Bld: 5.7 % (ref 4.6–6.5)

## 2017-11-26 NOTE — Patient Instructions (Signed)
Checking  Urine and lab today   You will be contacted about  Ultrasound of  Kidney and   Gallbladder area .   If all is normal  Then we can follow    And if  persistent or progressive sx plan follo up  Sometimes probitoics help sometimes not.   If all ok then  Yearly check up .  If recurrent uti or stome sx then  Will get you back to the urologist  But your exam is reassuring today.     Health Maintenance, Male A healthy lifestyle and preventive care is important for your health and wellness. Ask your health care provider about what schedule of regular examinations is right for you. What should I know about weight and diet? Eat a Healthy Diet  Eat plenty of vegetables, fruits, whole grains, low-fat dairy products, and lean protein.  Do not eat a lot of foods high in solid fats, added sugars, or salt.  Maintain a Healthy Weight Regular exercise can help you achieve or maintain a healthy weight. You should:  Do at least 150 minutes of exercise each week. The exercise should increase your heart rate and make you sweat (moderate-intensity exercise).  Do strength-training exercises at least twice a week.  Watch Your Levels of Cholesterol and Blood Lipids  Have your blood tested for lipids and cholesterol every 5 years starting at 61 years of age. If you are at high risk for heart disease, you should start having your blood tested when you are 61 years old. You may need to have your cholesterol levels checked more often if: ? Your lipid or cholesterol levels are high. ? You are older than 61 years of age. ? You are at high risk for heart disease.  What should I know about cancer screening? Many types of cancers can be detected early and may often be prevented. Lung Cancer  You should be screened every year for lung cancer if: ? You are a current smoker who has smoked for at least 30 years. ? You are a former smoker who has quit within the past 15 years.  Talk to your health care  provider about your screening options, when you should start screening, and how often you should be screened.  Colorectal Cancer  Routine colorectal cancer screening usually begins at 61 years of age and should be repeated every 5-10 years until you are 61 years old. You may need to be screened more often if early forms of precancerous polyps or small growths are found. Your health care provider may recommend screening at an earlier age if you have risk factors for colon cancer.  Your health care provider may recommend using home test kits to check for hidden blood in the stool.  A small camera at the end of a tube can be used to examine your colon (sigmoidoscopy or colonoscopy). This checks for the earliest forms of colorectal cancer.  Prostate and Testicular Cancer  Depending on your age and overall health, your health care provider may do certain tests to screen for prostate and testicular cancer.  Talk to your health care provider about any symptoms or concerns you have about testicular or prostate cancer.  Skin Cancer  Check your skin from head to toe regularly.  Tell your health care provider about any new moles or changes in moles, especially if: ? There is a change in a mole's size, shape, or color. ? You have a mole that is larger than a pencil  eraser.  Always use sunscreen. Apply sunscreen liberally and repeat throughout the day.  Protect yourself by wearing long sleeves, pants, a wide-brimmed hat, and sunglasses when outside.  What should I know about heart disease, diabetes, and high blood pressure?  If you are 61-34 years of age, have your blood pressure checked every 3-5 years. If you are 38 years of age or older, have your blood pressure checked every year. You should have your blood pressure measured twice-once when you are at a hospital or clinic, and once when you are not at a hospital or clinic. Record the average of the two measurements. To check your blood pressure  when you are not at a hospital or clinic, you can use: ? An automated blood pressure machine at a pharmacy. ? A home blood pressure monitor.  Talk to your health care provider about your target blood pressure.  If you are between 69-4 years old, ask your health care provider if you should take aspirin to prevent heart disease.  Have regular diabetes screenings by checking your fasting blood sugar level. ? If you are at a normal weight and have a low risk for diabetes, have this test once every three years after the age of 24. ? If you are overweight and have a high risk for diabetes, consider being tested at a younger age or more often.  A one-time screening for abdominal aortic aneurysm (AAA) by ultrasound is recommended for men aged 105-75 years who are current or former smokers. What should I know about preventing infection? Hepatitis B If you have a higher risk for hepatitis B, you should be screened for this virus. Talk with your health care provider to find out if you are at risk for hepatitis B infection. Hepatitis C Blood testing is recommended for:  Everyone born from [birth year] through 1964.  Anyone with known risk factors for hepatitis C.  Sexually Transmitted Diseases (STDs)  You should be screened each year for STDs including gonorrhea and chlamydia if: ? You are sexually active and are younger than 61 years of age. ? You are older than 61 years of age and your health care provider tells you that you are at risk for this type of infection. ? Your sexual activity has changed since you were last screened and you are at an increased risk for chlamydia or gonorrhea. Ask your health care provider if you are at risk.  Talk with your health care provider about whether you are at high risk of being infected with HIV. Your health care provider may recommend a prescription medicine to help prevent HIV infection.  What else can I do?  Schedule regular health, dental, and eye  exams.  Stay current with your vaccines (immunizations).  Do not use any tobacco products, such as cigarettes, chewing tobacco, and e-cigarettes. If you need help quitting, ask your health care provider.  Limit alcohol intake to no more than 2 drinks per day. One drink equals 12 ounces of beer, 5 ounces of wine, or 1 ounces of hard liquor.  Do not use street drugs.  Do not share needles.  Ask your health care provider for help if you need support or information about quitting drugs.  Tell your health care provider if you often feel depressed.  Tell your health care provider if you have ever been abused or do not feel safe at home. This information is not intended to replace advice given to you by your health care provider. Make sure you  discuss any questions you have with your health care provider. Document Released: 01/13/2008 Document Revised: 03/15/2016 Document Reviewed: 04/20/2015 Elsevier Interactive Patient Education  [employer].

## 2017-11-27 ENCOUNTER — Other Ambulatory Visit: Payer: Self-pay | Admitting: Internal Medicine

## 2017-11-27 DIAGNOSIS — R8290 Unspecified abnormal findings in urine: Secondary | ICD-10-CM

## 2017-12-03 ENCOUNTER — Other Ambulatory Visit (INDEPENDENT_AMBULATORY_CARE_PROVIDER_SITE_OTHER): Payer: BLUE CROSS/BLUE SHIELD

## 2017-12-03 ENCOUNTER — Encounter: Payer: Self-pay | Admitting: Internal Medicine

## 2017-12-03 DIAGNOSIS — R8290 Unspecified abnormal findings in urine: Secondary | ICD-10-CM | POA: Diagnosis not present

## 2017-12-03 LAB — URINALYSIS, ROUTINE W REFLEX MICROSCOPIC
BILIRUBIN URINE: NEGATIVE
Ketones, ur: NEGATIVE
LEUKOCYTES UA: NEGATIVE
NITRITE: NEGATIVE
Specific Gravity, Urine: 1.02 (ref 1.000–1.030)
TOTAL PROTEIN, URINE-UPE24: NEGATIVE
URINE GLUCOSE: NEGATIVE
Urobilinogen, UA: 0.2 (ref 0.0–1.0)
pH: 5.5 (ref 5.0–8.0)

## 2017-12-06 ENCOUNTER — Ambulatory Visit
Admission: RE | Admit: 2017-12-06 | Discharge: 2017-12-06 | Disposition: A | Discharge status: Home / Self Care | Payer: BLUE CROSS/BLUE SHIELD | Source: Ambulatory Visit | Attending: Internal Medicine | Admitting: Internal Medicine

## 2017-12-06 DIAGNOSIS — N209 Urinary calculus, unspecified: Secondary | ICD-10-CM

## 2017-12-06 DIAGNOSIS — R1011 Right upper quadrant pain: Secondary | ICD-10-CM

## 2017-12-06 DIAGNOSIS — Z87440 Personal history of urinary (tract) infections: Secondary | ICD-10-CM

## 2017-12-06 DIAGNOSIS — Z87442 Personal history of urinary calculi: Secondary | ICD-10-CM | POA: Diagnosis not present

## 2017-12-06 DIAGNOSIS — R140 Abdominal distension (gaseous): Secondary | ICD-10-CM | POA: Diagnosis not present

## 2017-12-06 DIAGNOSIS — R198 Other specified symptoms and signs involving the digestive system and abdomen: Secondary | ICD-10-CM

## 2017-12-25 ENCOUNTER — Ambulatory Visit (INDEPENDENT_AMBULATORY_CARE_PROVIDER_SITE_OTHER): Payer: BLUE CROSS/BLUE SHIELD | Admitting: Internal Medicine

## 2017-12-25 ENCOUNTER — Encounter: Payer: Self-pay | Admitting: Internal Medicine

## 2017-12-25 VITALS — BP 146/88 | HR 71 | Temp 98.3°F | Wt 213.2 lb

## 2017-12-25 DIAGNOSIS — L255 Unspecified contact dermatitis due to plants, except food: Secondary | ICD-10-CM

## 2017-12-25 MED ORDER — FLUOCINONIDE-E 0.05 % EX CREA: 1.0000 "application " | TOPICAL_CREAM | Freq: Two times a day (BID) | CUTANEOUS | 1 refills | Status: AC

## 2017-12-25 MED ORDER — PREDNISONE 20 MG PO TABS: ORAL_TABLET | ORAL | 0 refills | Status: DC

## 2017-12-25 NOTE — Patient Instructions (Signed)
I    Agree this is   Contact dermatitis from plant .    Try topical and if be comes too extensive can add prednisone    Poison Ivy Dermatitis Poison ivy dermatitis is inflammation of the skin that is caused by the allergens on the leaves of the poison ivy plant. The skin reaction often involves redness, swelling, blisters, and extreme itching. What are the causes? This condition is caused by a specific chemical (urushiol) found in the sap of the poison ivy plant. This chemical is sticky and can be easily spread to people, animals, and objects. You can get poison ivy dermatitis by:  Having direct contact with a poison ivy plant.  Touching animals, other people, or objects that have come in contact with poison ivy and have the chemical on them.  What increases the risk? This condition is more likely to develop in:  People who are outdoors often.  People who go outdoors without wearing protective clothing, such as closed shoes, long pants, and a long-sleeved shirt.  What are the signs or symptoms? Symptoms of this condition include:  Redness and itching.  A rash that often includes bumps and blisters. The rash usually appears 48 hours after exposure.  Swelling. This may occur if the reaction is more severe.  Symptoms usually last for 1-2 weeks. However, the first time you develop this condition, symptoms may last 3-4 weeks. How is this diagnosed? This condition may be diagnosed based on your symptoms and a physical exam. Your health care provider may also ask you about any recent outdoor activity. How is this treated? Treatment for this condition will vary depending on how severe it is. Treatment may include:  Hydrocortisone creams or calamine lotions to relieve itching.  Oatmeal baths to soothe the skin.  Over-the-counter antihistamine tablets.  Oral steroid medicine for more severe outbreaks.  Follow these instructions at home:  Take or apply over-the-counter and  prescription medicines only as told by your health care provider.  Wash exposed skin as soon as possible with soap and cold water.  Use hydrocortisone creams or calamine lotion as needed to soothe the skin and relieve itching.  Take oatmeal baths as needed. Use colloidal oatmeal. You can get this at your local pharmacy or grocery store. Follow the instructions on the packaging.  Do not scratch or rub your skin.  While you have the rash, wash clothes right after you wear them. How is this prevented?  Learn to identify the poison ivy plant and avoid contact with the plant. This plant can be recognized by the number of leaves. Generally, poison ivy has three leaves with flowering branches on a single stem. The leaves are typically glossy, and they have jagged edges that come to a point at the front.  If you have been exposed to poison ivy, thoroughly wash with soap and water right away. You have about 30 minutes to remove the plant resin before it will cause the rash. Be sure to wash under your fingernails because any plant resin there will continue to spread the rash.  When hiking or camping, wear clothes that will help you to avoid exposure on the skin. This includes long pants, a long-sleeved shirt, tall socks, and hiking boots. You can also apply preventive lotion to your skin to help limit exposure.  If you suspect that your clothes or outdoor gear came in contact with poison ivy, rinse them off outside with a garden hose before you bring them inside your  house. Contact a health care provider if:  You have open sores in the rash area.  You have more redness, swelling, or pain in the affected area.  You have redness that spreads beyond the rash area.  You have fluid, blood, or pus coming from the affected area.  You have a fever.  You have a rash over a large area of your body.  You have a rash on your eyes, mouth, or genitals.  Your rash does not improve after a few days. Get  help right away if:  Your face swells or your eyes swell shut.  You have trouble breathing.  You have trouble swallowing. This information is not intended to replace advice given to you by your health care provider. Make sure you discuss any questions you have with your health care provider. Document Released: 07/14/2000 Document Revised: 12/23/2015 Document Reviewed: 12/23/2014 Elsevier Interactive Patient Education  [employer].

## 2017-12-25 NOTE — Progress Notes (Signed)
Chief Complaint  Patient presents with  . Poison Ivy    Pt has a rash on his inner right arm spreading to right torso. Pt states that the rash is very itchy and hurts at times. 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 Pt has had the rash x 1 week.     HPI: Carlos Gaines 62 y.o. sda   1 weeks of rash right upper  Arm and now on trunk .   And topical steroid  fluocinonide . And some better   .  After 1-2 days ( wifes rx)    No hx of same since a chilkd   Was working in yard and  Carrying  Objects under right arm area   .  ROS: See pertinent positives and negatives per HPI. No fever systemic sx   Past Medical History:  Diagnosis Date  . Asthma    hx allergy shots in [place]   . Basal cell carcinoma, lip    albertinei resection 2017  . Chicken pox   . GERD (gastroesophageal reflux disease)    endo neg x 2? on  chornnic ppi  . H/O cardiac catheterization    neg wu for sob  in ed   . Heart murmur    cardiac cath neg for sob   . Hyperlipidemia    orig  270 ranges  on meds for 10 years   . Hypertension    white coat   . Nephrolithiasis, uric acid    herrick no hx of gout  fam hx of gout  . UTI (lower urinary tract infection)    x 1 with fever     Family History  Problem Relation Age of Onset  . Arthritis Father   . Hyperlipidemia Father   . Aneurysm Father   . Graves' disease Sister     Social History   Socioeconomic History  . Marital status: Married    Spouse name: Not on file  . Number of children: Not on file  . Years of education: Not on file  . Highest education level: Not on file  Occupational History  . Not on file  Social Needs  . Financial resource strain: Not on file  . Food insecurity:    Worry: Not on file    Inability: Not on file  . Transportation needs:    Medical: Not on file    Non-medical: Not on file  Tobacco Use  . Smoking status: Never Smoker  . Smokeless tobacco: Never Used  Substance and Sexual Activity  . Alcohol use:  Yes    Alcohol/week: 2.4 - 3.0 oz    Types: 4 - 5 Standard drinks or equivalent per week  . Drug use: No  . Sexual activity: Not on file  Lifestyle  . Physical activity:    Days per week: Not on file    Minutes per session: Not on file  . Stress: Not on file  Relationships  . Social connections:    Talks on phone: Not on file    Gets together: Not on file    Attends religious service: Not on file    Active member of club or organization: Not on file    Attends meetings of clubs or organizations: Not on file    Relationship status: Not on file  Other Topics Concern  . Not on file  Social History Narrative   7 hours of sleep per night   hh of 2  Patient lives with his wife.   Carlos Gaines:  Moved from [place].    Job Conservation officer, historic buildings ;. 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 ranitidine (ZANTAC) 150 MG tablet Take 75 mg by mouth as needed for heartburn.     . simvastatin (ZOCOR) 40 MG tablet TAKE 1 TABLET BY MOUTH EVERY EVENING 90 tablet 0   Facility-Administered Medications Prior to Visit  Medication Dose Route Frequency Provider Last Rate Last Dose  . 0.9 %  sodium chloride infusion  500 mL Intravenous Continuous Pyrtle, Lajuan Lines, MD         EXAM:  BP (!) 146/88 (BP Location: Left Arm, Patient Position: Sitting, Cuff Size: Normal)   Pulse 71   Temp 98.3 F (36.8 C) (Oral)   Wt 213 lb 3.2 oz (96.7 kg)   BMI 30.81 kg/m   Body mass index is 30.81 kg/m.  GENERAL: vitals reviewed and listed above, alert, oriented, appears well hydrated and in no acute distress HEENT: atraumatic, conjunctiva  clear, no obvious abnormalities on inspection of external nose and ears skin  Right  Unde  Upper arm under  With loarge blotch of yed some  Contact derm  And a few point area left arm rest clear    MS: moves  all extremities without noticeable focal  abnormality PSYCH: pleasant and cooperative, no obvious depression or anxiety  ASSESSMENT AND PLAN:  Discussed the following assessment and plan:  Plant dermatitis Contact dermatitis      Expectant management.    Topical rx  Avoidance and   Can add pred if needed -Patient advised to return or notify health care team  if symptoms worsen ,persist or new concerns arise.  Patient Instructions  I    Agree this is   Contact dermatitis from plant .    Try topical and if be comes too extensive can add prednisone    Poison Ivy Dermatitis Poison ivy dermatitis is inflammation of the skin that is caused by the allergens on the leaves of the poison ivy plant. The skin reaction often involves redness, swelling, blisters, and extreme itching. What are the causes? This condition is caused by a specific chemical (urushiol) found in the sap of the poison ivy plant. This chemical is sticky and can be easily spread to people, animals, and objects. You can get poison ivy dermatitis by:  Having direct contact with a poison ivy plant.  Touching animals, other people, or objects that have come in contact with poison ivy and have the chemical on them.  What increases the risk? This condition is more likely to develop in:  People who are outdoors often.  People who go outdoors without wearing protective clothing, such as closed shoes, long pants, and a long-sleeved shirt.  What are the signs or symptoms? Symptoms of this condition include:  Redness and itching.  A rash that often includes bumps and blisters. The rash usually appears 48 hours after exposure.  Swelling. This may occur if the reaction is more severe.  Symptoms usually last for 1-2 weeks. However, the first time you develop this condition, symptoms may last 3-4 weeks. How is this diagnosed? This condition may be diagnosed based on your symptoms and a physical exam. Your health care  provider  may also ask you about any recent outdoor activity. How is this treated? Treatment for this condition will vary depending on how severe it is. Treatment may include:  Hydrocortisone creams or calamine lotions to relieve itching.  Oatmeal baths to soothe the skin.  Over-the-counter antihistamine tablets.  Oral steroid medicine for more severe outbreaks.  Follow these instructions at home:  Take or apply over-the-counter and prescription medicines only as told by your health care provider.  Wash exposed skin as soon as possible with soap and cold water.  Use hydrocortisone creams or calamine lotion as needed to soothe the skin and relieve itching.  Take oatmeal baths as needed. Use colloidal oatmeal. You can get this at your local pharmacy or grocery store. Follow the instructions on the packaging.  Do not scratch or rub your skin.  While you have the rash, wash clothes right after you wear them. How is this prevented?  Learn to identify the poison ivy plant and avoid contact with the plant. This plant can be recognized by the number of leaves. Generally, poison ivy has three leaves with flowering branches on a single stem. The leaves are typically glossy, and they have jagged edges that come to a point at the front.  If you have been exposed to poison ivy, thoroughly wash with soap and water right away. You have about 30 minutes to remove the plant resin before it will cause the rash. Be sure to wash under your fingernails because any plant resin there will continue to spread the rash.  When hiking or camping, wear clothes that will help you to avoid exposure on the skin. This includes long pants, a long-sleeved shirt, tall socks, and hiking boots. You can also apply preventive lotion to your skin to help limit exposure.  If you suspect that your clothes or outdoor gear came in contact with poison ivy, rinse them off outside with a garden hose before you bring them inside your  house. Contact a health care provider if:  You have open sores in the rash area.  You have more redness, swelling, or pain in the affected area.  You have redness that spreads beyond the rash area.  You have fluid, blood, or pus coming from the affected area.  You have a fever.  You have a rash over a large area of your body.  You have a rash on your eyes, mouth, or genitals.  Your rash does not improve after a few days. Get help right away if:  Your face swells or your eyes swell shut.  You have trouble breathing.  You have trouble swallowing. This information is not intended to replace advice given to you by your health care provider. Make sure you discuss any questions you have with your health care provider. Document Released: 07/14/2000 Document Revised: 12/23/2015 Document Reviewed: 12/23/2014 Elsevier Interactive Patient Education  2018 [place]. Khailee Mick M.D.

## 2018-03-01 ENCOUNTER — Other Ambulatory Visit: Payer: Self-pay | Admitting: Internal Medicine

## 2018-04-24 DIAGNOSIS — H04123 Dry eye syndrome of bilateral lacrimal glands: Secondary | ICD-10-CM | POA: Diagnosis not present

## 2018-05-06 DIAGNOSIS — L821 Other seborrheic keratosis: Secondary | ICD-10-CM | POA: Diagnosis not present

## 2018-05-06 DIAGNOSIS — L858 Other specified epidermal thickening: Secondary | ICD-10-CM | POA: Diagnosis not present

## 2018-05-06 DIAGNOSIS — L814 Other melanin hyperpigmentation: Secondary | ICD-10-CM | POA: Diagnosis not present

## 2018-05-06 DIAGNOSIS — D229 Melanocytic nevi, unspecified: Secondary | ICD-10-CM | POA: Diagnosis not present

## 2018-06-11 ENCOUNTER — Ambulatory Visit (INDEPENDENT_AMBULATORY_CARE_PROVIDER_SITE_OTHER): Payer: BLUE CROSS/BLUE SHIELD

## 2018-06-11 DIAGNOSIS — Z23 Encounter for immunization: Secondary | ICD-10-CM

## 2018-10-16 ENCOUNTER — Other Ambulatory Visit: Payer: Self-pay

## 2018-10-16 NOTE — Telephone Encounter (Signed)
Call in Ventolin HFA to use 2 puffs every 4 hours prn SOB, #1 with no rf

## 2018-10-18 ENCOUNTER — Telehealth: Payer: Self-pay | Admitting: Internal Medicine

## 2018-10-18 NOTE — Telephone Encounter (Signed)
Mickel Baas, [facility] in [place], [place] called and says she needs more information on a patient who a prescription was called, she says they never filled medications for the patient and questioned if this is correct. I asked the name of the medication, she says ventolin. I advised it's not on his profile and asked who sent it, she says it was called in by [facility] ([facility] I placed her on hold, called the office and spoke to Benjamin, [facility] and advised of the above, she asked [facility], CMA about the call and was told the patient requested this pharmacy, because he moved to the area. I advised Mickel Baas, she asks for clarification on the credentials of Dr. Sarajane Jews, his NPI and DEA was given.

## 2018-11-02 ENCOUNTER — Other Ambulatory Visit: Payer: Self-pay | Admitting: Internal Medicine

## 2018-11-11 ENCOUNTER — Other Ambulatory Visit: Payer: Self-pay | Admitting: Family Medicine

## 2018-11-25 ENCOUNTER — Other Ambulatory Visit: Payer: Self-pay | Admitting: Internal Medicine

## 2018-11-26 NOTE — Telephone Encounter (Signed)
Pt is scheduled for virtual visit.

## 2018-11-28 NOTE — Progress Notes (Signed)
Virtual Visit via Video Note  I connected with@ on 11/29/18 at 11:00 AM EDT by a video enabled telemedicine application and verified that I am speaking with the correct person using two identifiers. Location patient: home Location provider:work  office Persons participating in the virtual visit: patient, provider  [place] national recommendations  regarding COVID 19 pandemic   video visit is advised over in office visit for this patient.  Discussed the limitations of evaluation and management by telemedicine and  availability of in person appointments. The patient expressed understanding and agreed to proceed.   HPI: Carlos Gaines Presents for video visit for med eval   He now lives in [place]:  on med without se and trying to eat healthy continues to exercise  working up to 16 hours  Per day during the ppp ( small bank covid labs)   Bp has been 125/72 and 138/77   Weight  218 - 219    ROS: See pertinent positives and negatives per HPI. No cv pulmanary gi gu problems  Off heartburn meds   Past Medical History:  Diagnosis Date  . Asthma    hx allergy shots in [place]   . Basal cell carcinoma, lip    albertinei resection 2017  . Chicken pox   . GERD (gastroesophageal reflux disease)    endo neg x 2? on  chornnic ppi  . H/O cardiac catheterization    neg wu for sob  in ed   . Heart murmur    cardiac cath neg for sob   . Hyperlipidemia    orig  270 ranges  on meds for 10 years   . Hypertension    white coat   . Nephrolithiasis, uric acid    herrick no hx of gout  fam hx of gout  . UTI (lower urinary tract infection)    x 1 with fever     Past Surgical History:  Procedure Laterality Date  . CARDIAC CATHETERIZATION  2008  . shoulder repair  2009   2009/ right shoulder  . TONSILLECTOMY  1963   1963    Family History  Problem Relation Age of Onset  . Arthritis Father   . Hyperlipidemia Father   . Aneurysm Father   . Graves' disease Sister     Social History    Tobacco Use  . Smoking status: Never Smoker  . Smokeless tobacco: Never Used  Substance Use Topics  . Alcohol use: Yes    Alcohol/week: 4.0 - 5.0 standard drinks    Types: 4 - 5 Standard drinks or equivalent per week  . Drug use: No      Current Outpatient Medications:  .  fluocinonide-emollient (LIDEX-E) 0.05 % cream, Apply 1 application topically 2 (two) times daily., Disp: 30 g, Rfl: 1 .  simvastatin (ZOCOR) 40 MG tablet, Take 1 tablet (40 mg total) by mouth every evening., Disp: 90 tablet, Rfl: 0 .  VENTOLIN HFA 108 (90 Base) MCG/ACT inhaler, USE 2 PUFFS EVERY 4 HOURS FOR SHORTNESS OF BREATH, Disp: 18 Inhaler, Rfl: 0  Current Facility-Administered Medications:  .  0.9 %  sodium chloride infusion, 500 mL, Intravenous, Continuous, Pyrtle, Lajuan Lines, MD  EXAM: BP Readings from Last 3 Encounters:  12/25/17 (!) 146/88  11/26/17 118/76  09/17/17 122/60    VITALS per patient if applicable:  GENERAL: alert, oriented, appears well and in no acute distress  HEENT: atraumatic, conjunttiva clear, no obvious abnormalities on inspection of external nose and ears  NECK: normal movements of the head and neck  LUNGS: on inspection no signs of respiratory distress, breathing rate appears normal, no obvious gross SOB, gasping or wheezing  CV: no obvious cyanosis  MS: moves all visible extremities without noticeable abnormality  PSYCH/NEURO: pleasant and cooperative, no obvious depression or anxiety, speech and thought processing grossly intact Lab Results  Component Value Date   WBC 6.3 11/26/2017   HGB 15.1 11/26/2017   HCT 44.3 11/26/2017   PLT 287.0 11/26/2017   GLUCOSE 81 11/26/2017   CHOL 127 11/26/2017   TRIG 80.0 11/26/2017   HDL 44.00 11/26/2017   LDLDIRECT 106.0 11/15/2015   LDLCALC 67 11/26/2017   ALT 20 11/26/2017   AST 29 11/26/2017   NA 140 11/26/2017   K 4.6 11/26/2017   CL 102 11/26/2017   CREATININE 1.11 11/26/2017   BUN 27 (H) 11/26/2017   CO2 27  11/26/2017   TSH 3.96 11/14/2016   PSA 0.32 11/26/2017   HGBA1C 5.7 11/26/2017    ASSESSMENT AND PLAN:  Discussed the following assessment and plan:  Hyperlipidemia, unspecified hyperlipidemia type - Plan: Basic metabolic panel, Hepatic function panel, Lipid panel, CBC with Differential/Platelet, Hemoglobin A1c  Medication management - Plan: Basic metabolic panel, Hepatic function panel, Lipid panel, CBC with Differential/Platelet, Hemoglobin A1c  Fasting hyperglycemia - Plan: Basic metabolic panel, Hepatic function panel, Lipid panel, CBC with Differential/Platelet, Hemoglobin A1c  Need for hepatitis C screening test - Plan: Hepatitis C antibody  Screening PSA (prostate specific antigen) - Plan: PSA  Counseled.  Disc  Lab screening future when he is bak in town ? June and then go from there   Will refill medication for 90 days  Until labs   Low risk but will do hep c screen.   Expectant management and discussion of plan and treatment with patient with opportunity to ask questions and all were answered. The patient agreed with the plan and demonstrated an understanding of the instructions.   The patient was advised to call back or seek an in-person evaluation if  having concerns .    Shanon Ace, MD

## 2018-11-29 ENCOUNTER — Ambulatory Visit (INDEPENDENT_AMBULATORY_CARE_PROVIDER_SITE_OTHER): Payer: BLUE CROSS/BLUE SHIELD | Admitting: Internal Medicine

## 2018-11-29 ENCOUNTER — Encounter: Payer: Self-pay | Admitting: Internal Medicine

## 2018-11-29 ENCOUNTER — Other Ambulatory Visit: Payer: Self-pay

## 2018-11-29 DIAGNOSIS — Z125 Encounter for screening for malignant neoplasm of prostate: Secondary | ICD-10-CM

## 2018-11-29 DIAGNOSIS — R7301 Impaired fasting glucose: Secondary | ICD-10-CM

## 2018-11-29 DIAGNOSIS — Z79899 Other long term (current) drug therapy: Secondary | ICD-10-CM

## 2018-11-29 DIAGNOSIS — Z1159 Encounter for screening for other viral diseases: Secondary | ICD-10-CM

## 2018-11-29 DIAGNOSIS — E785 Hyperlipidemia, unspecified: Secondary | ICD-10-CM

## 2018-11-29 MED ORDER — SIMVASTATIN 40 MG PO TABS: 40.0000 mg | ORAL_TABLET | Freq: Every evening | ORAL | 0 refills | Status: DC

## 2018-12-03 IMAGING — US US RENAL
1 series · 14 of 25 positions shown · non-contrast
Comparison: None.

CLINICAL DATA: History of kidney stones.

EXAM:
RENAL / URINARY TRACT ULTRASOUND COMPLETE

[Series 1: us renal · 0.17mm/px · 14 of 39 slices shown]
[im 1/39]
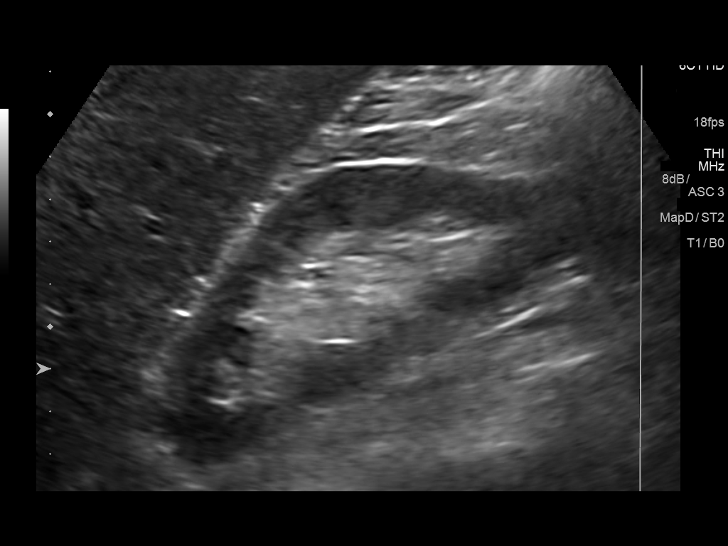
[im 4/39]
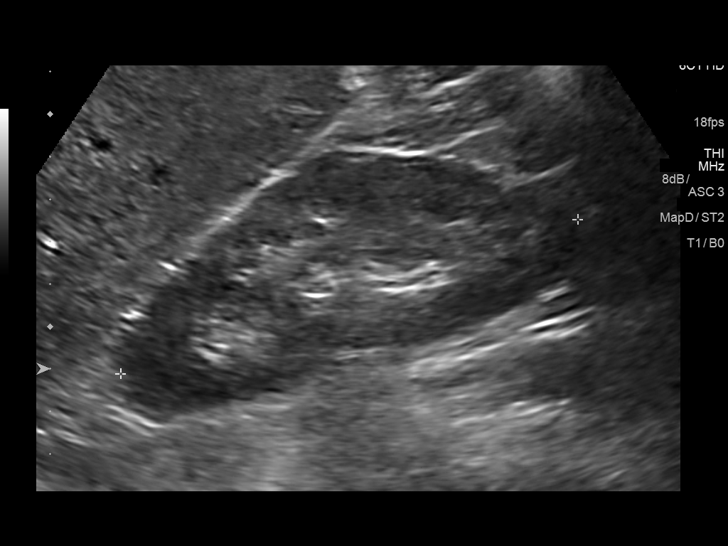
[im 7/39]
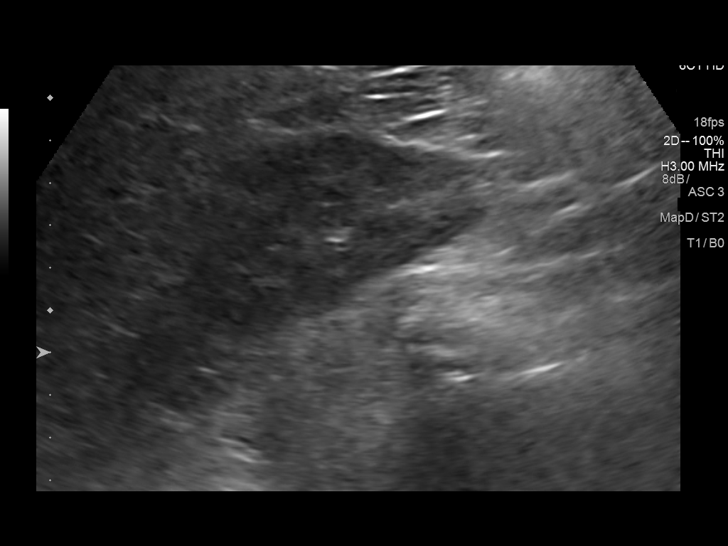
[im 10/39]
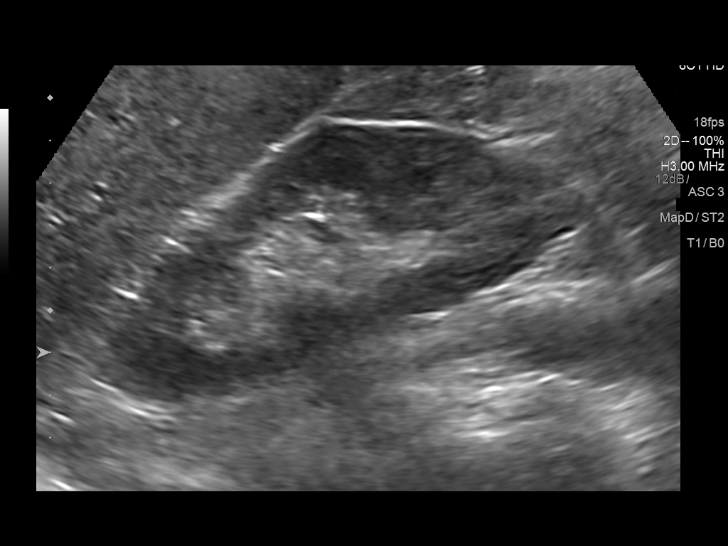
[im 13/39]
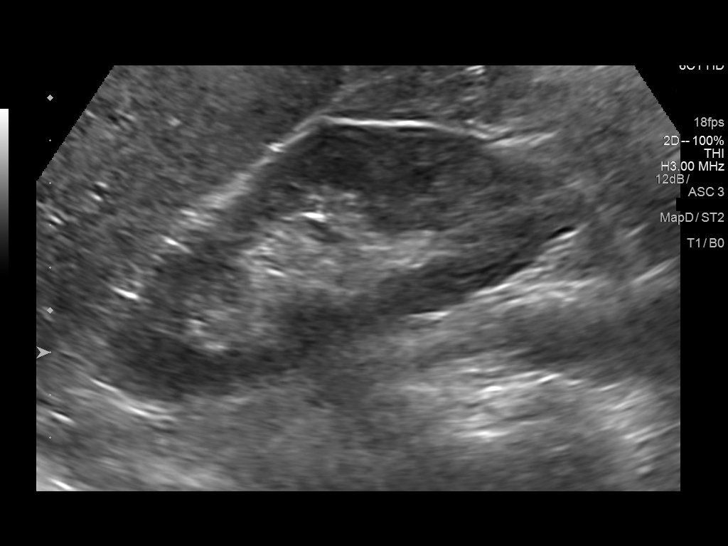
[im 15/39]
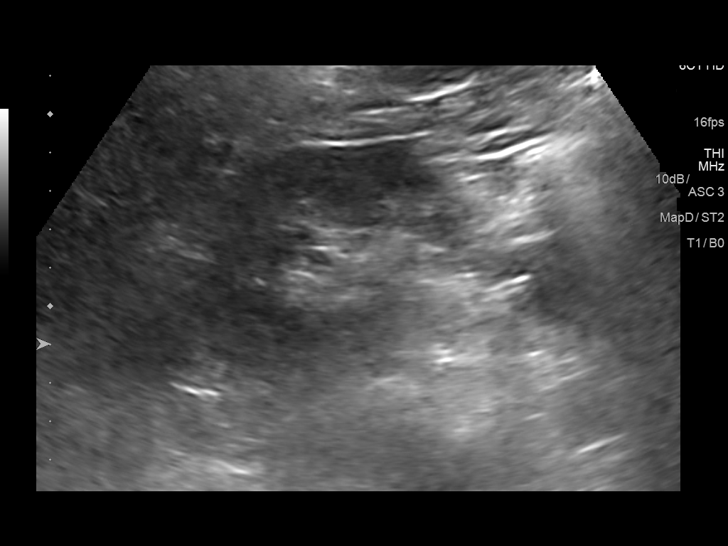
[im 18/39]
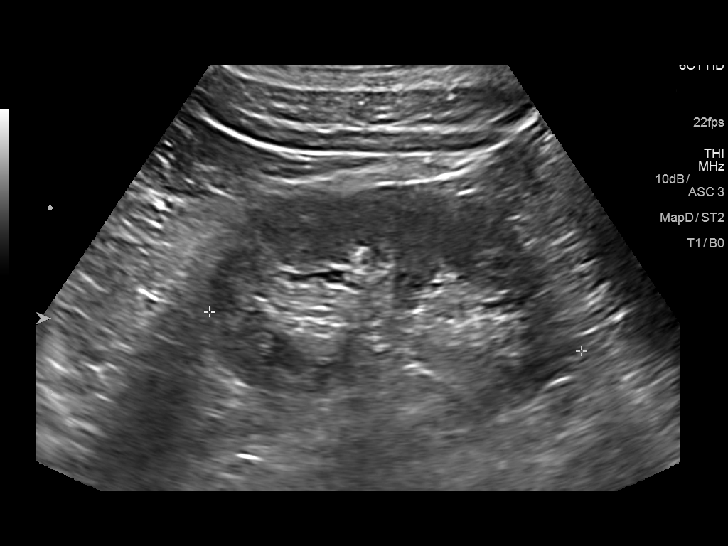
[im 21/39]
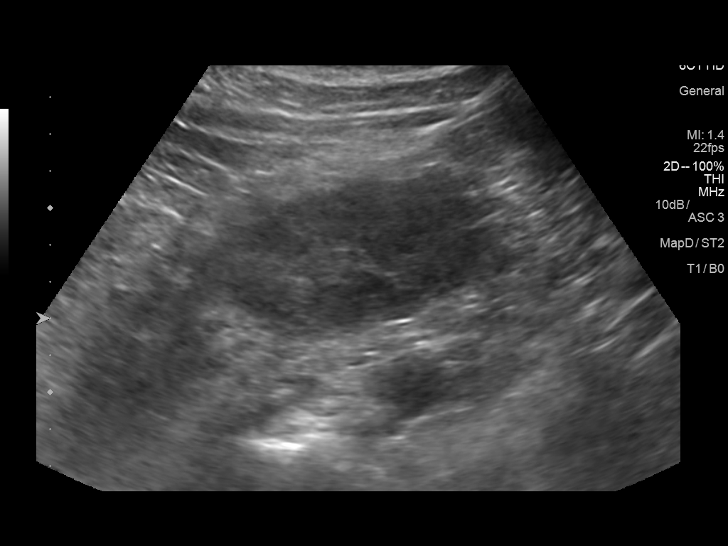
[im 24/39]
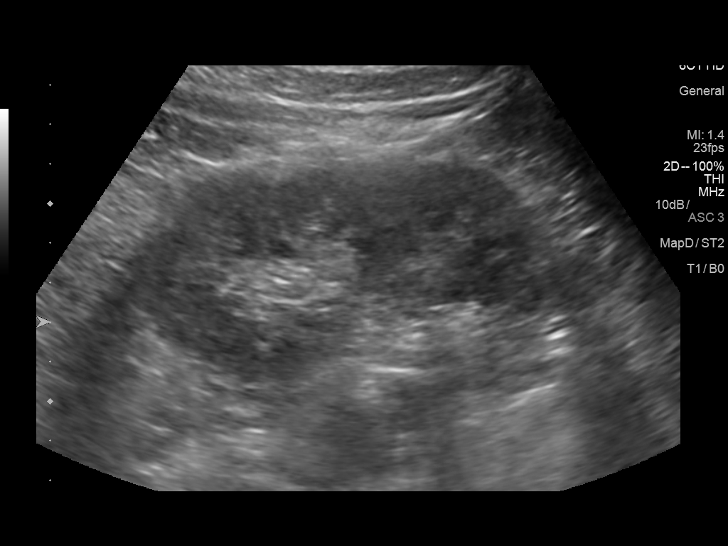
[im 26/39]
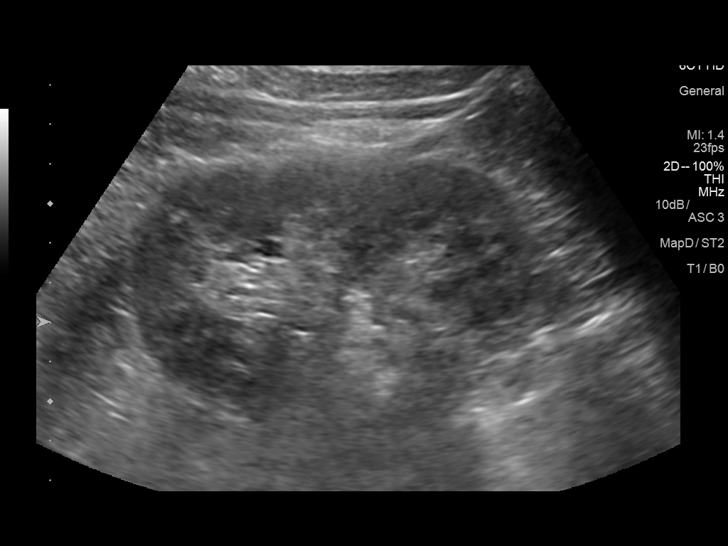
[im 29/39]
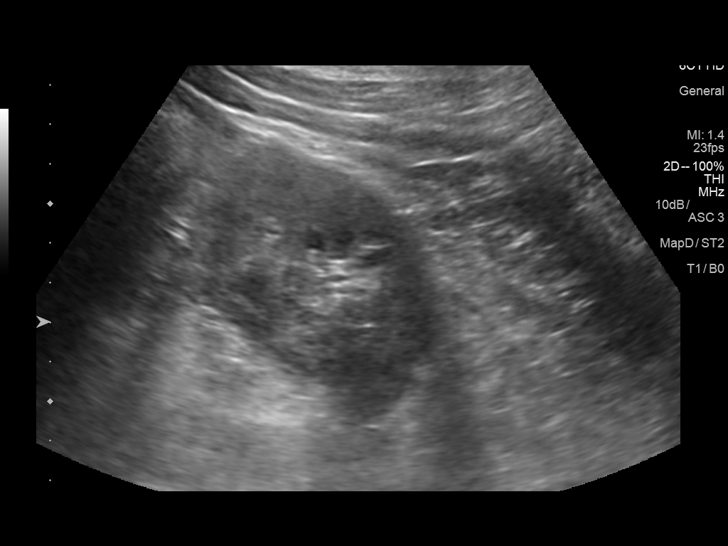
[im 32/39]
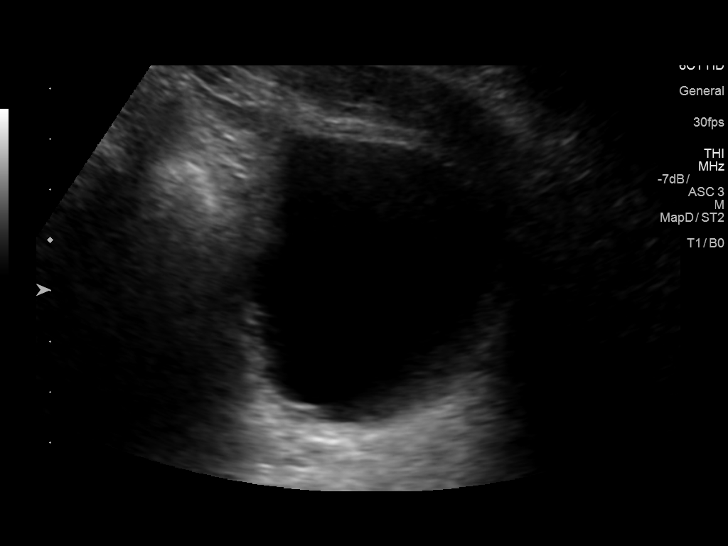
[im 35/39]
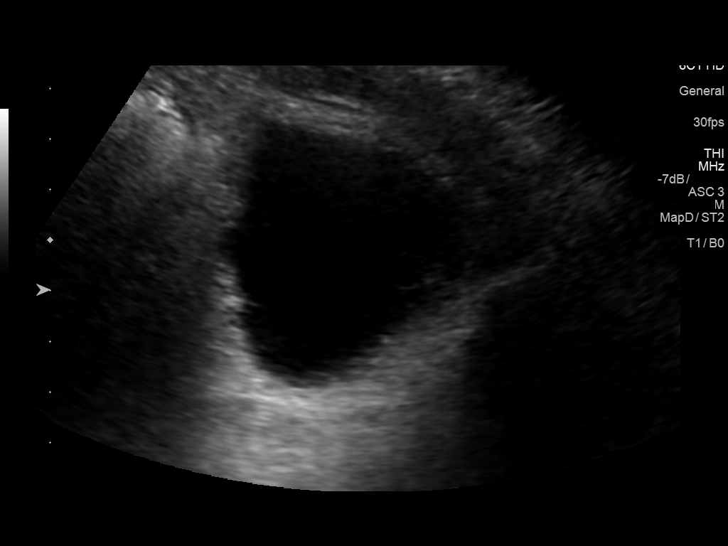
[im 39/39]
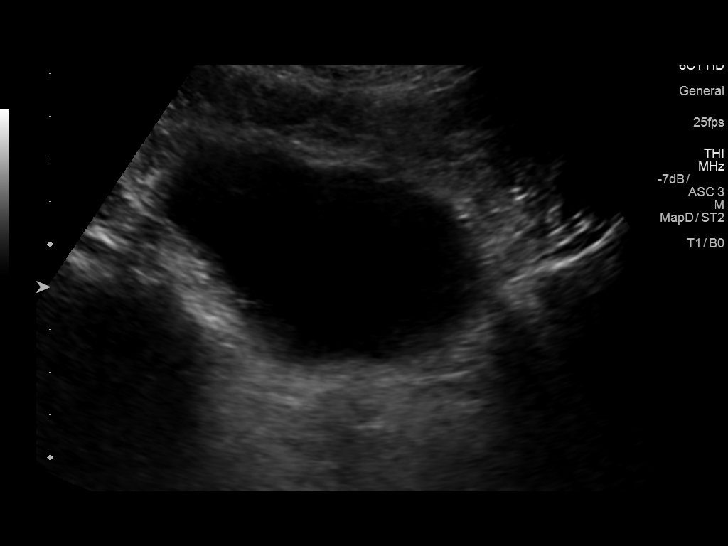

[14 of 25 positions shown; findings below may reference images not displayed]

FINDINGS: Right Kidney:

Length: 11.8 cm. Echogenicity within normal limits. No mass or
hydronephrosis visualized.

Left Kidney:

Length: 10.5 cm. Echogenicity within normal limits. No mass or
hydronephrosis visualized.

Bladder:

Appears normal for degree of bladder distention.
IMPRESSION: Normal renal ultrasound.

## 2018-12-03 IMAGING — US US ABDOMEN LIMITED
1 series · 14 of 25 positions shown · non-contrast
Comparison: None.

CLINICAL DATA: Right upper quadrant discomfort for 2 months.

EXAM:
ULTRASOUND ABDOMEN LIMITED RIGHT UPPER QUADRANT

[Series 1: us abdomen limited · 0.21mm/px · 14 of 45 slices shown]
[im 1/45]
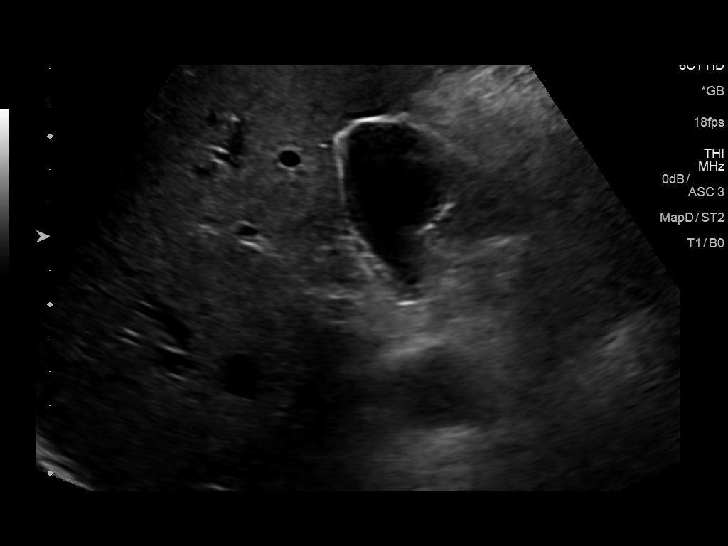
[im 4/45]
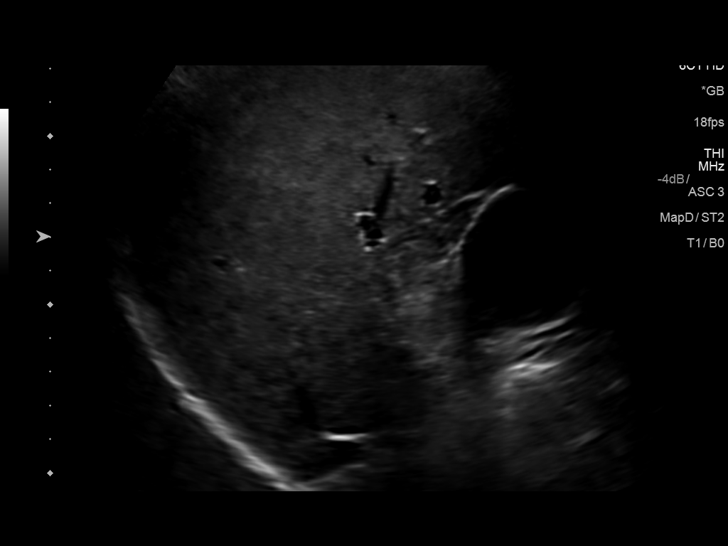
[im 8/45]
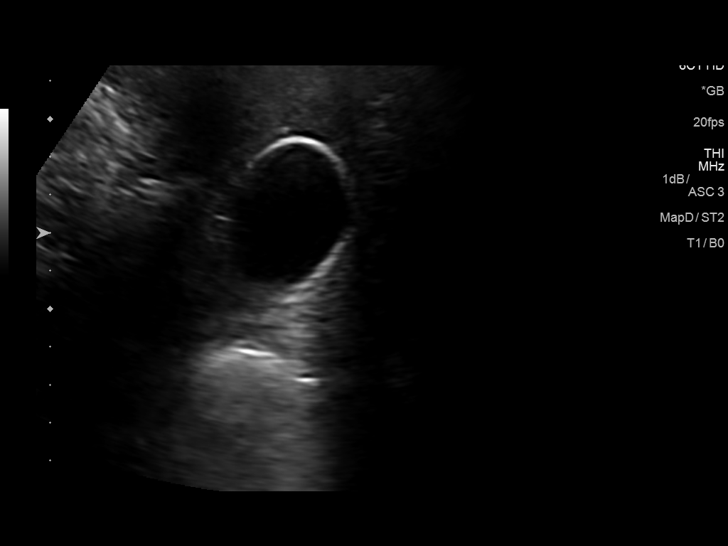
[im 12/45]
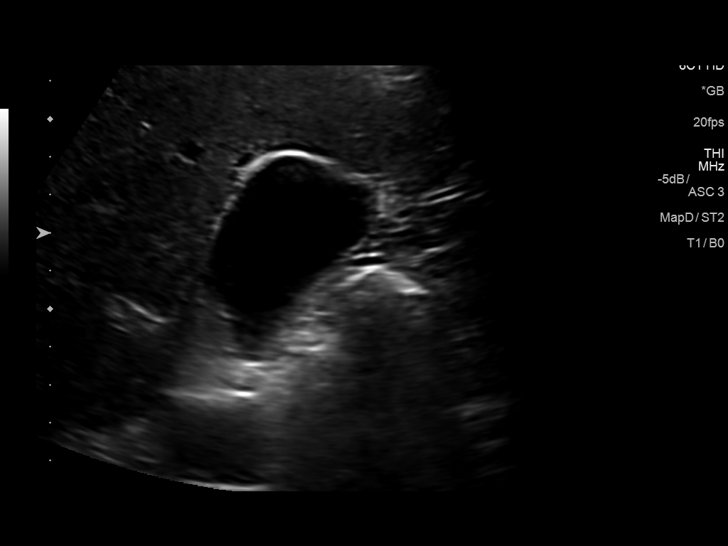
[im 15/45]
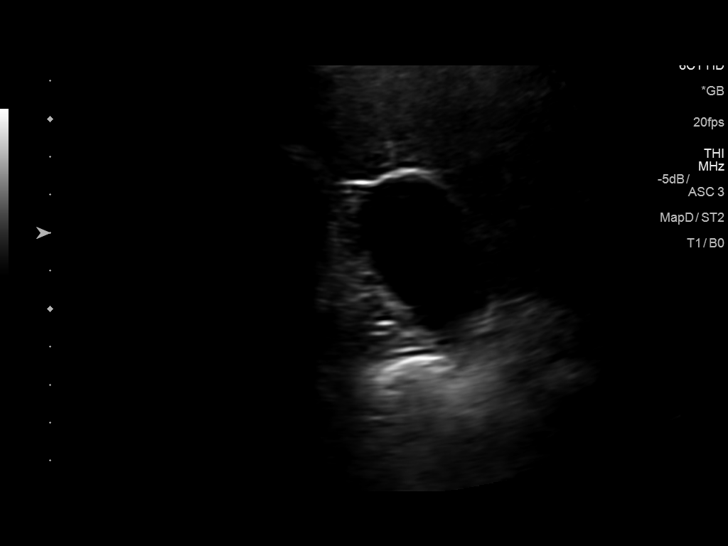
[im 17/45]
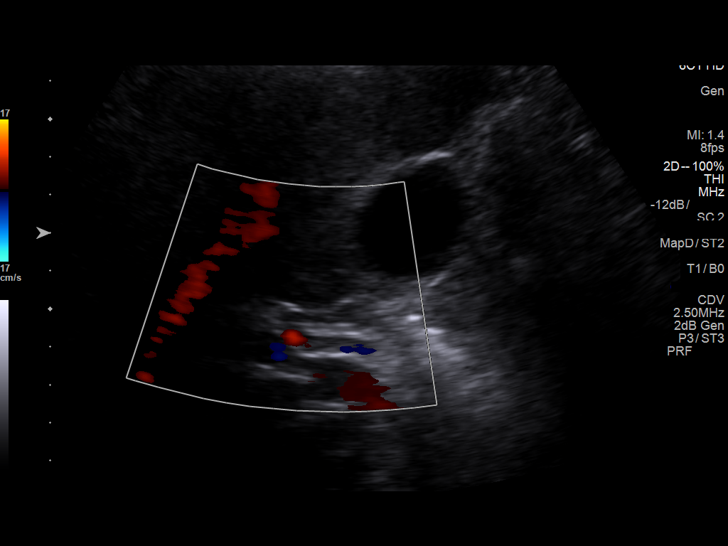
[im 21/45]
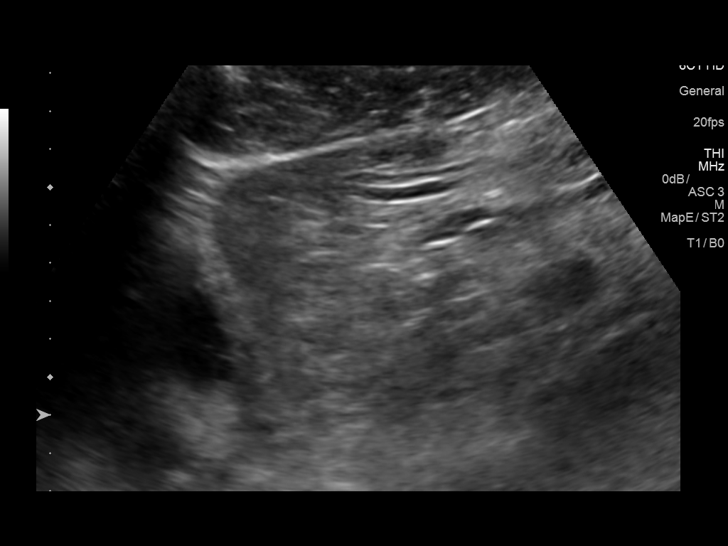
[im 24/45]
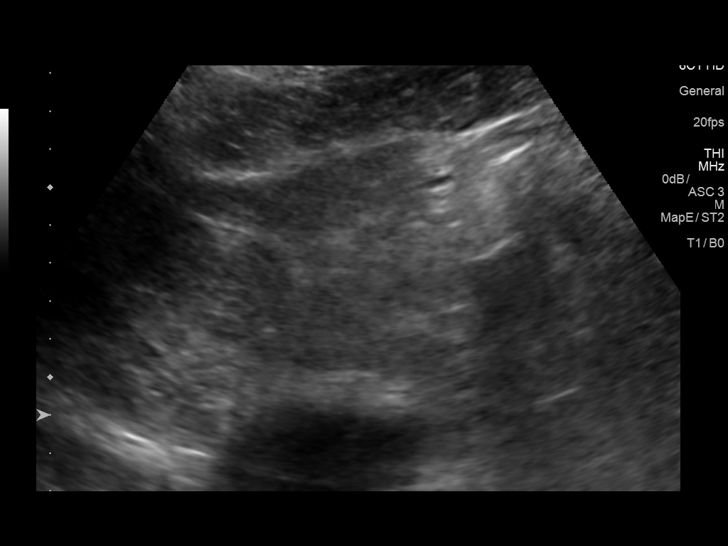
[im 28/45]
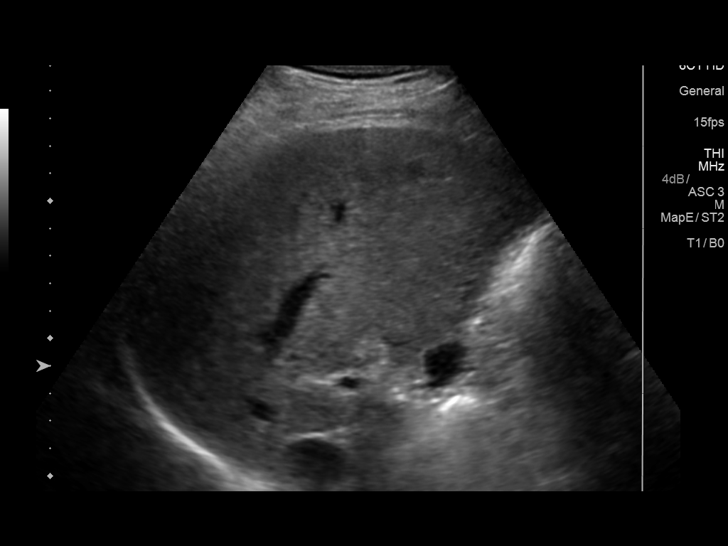
[im 30/45]
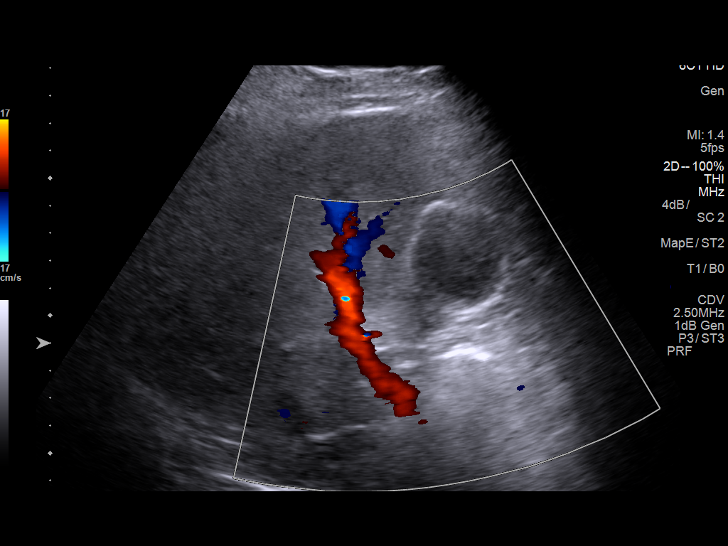
[im 34/45]
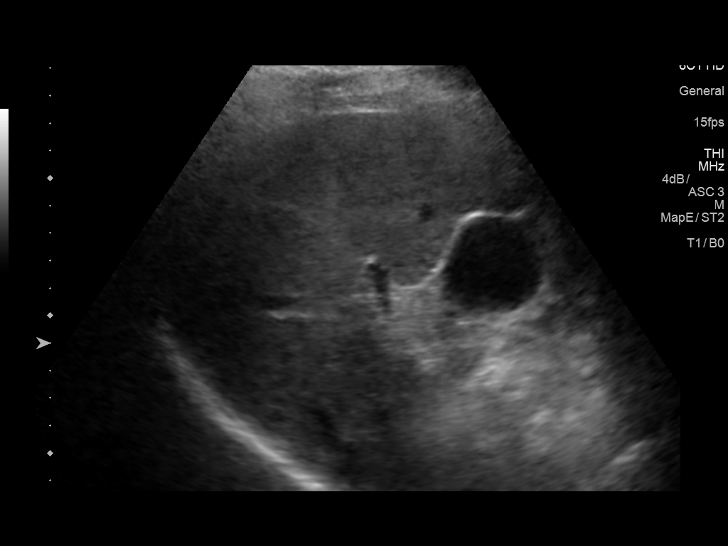
[im 37/45]
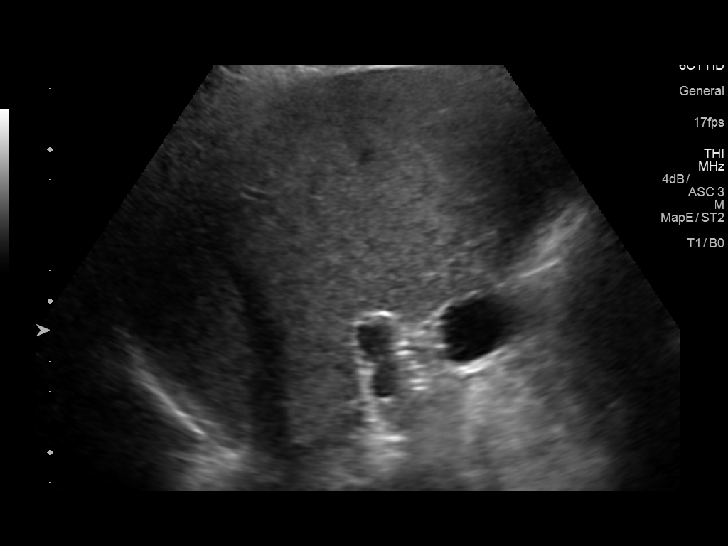
[im 41/45]
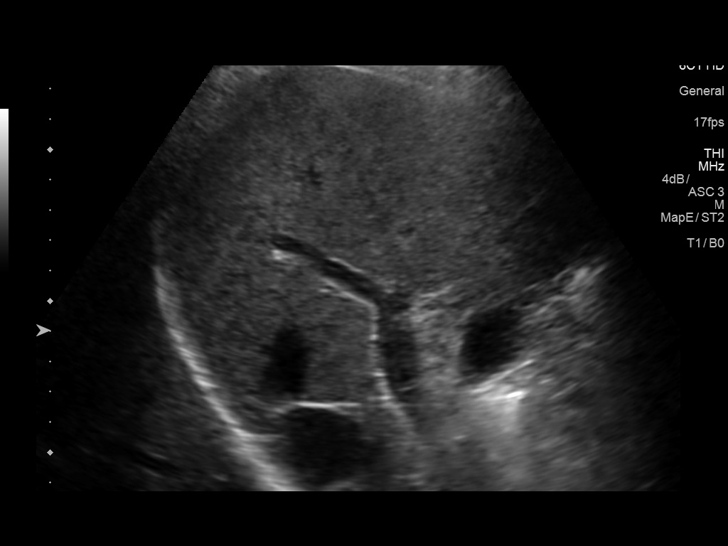
[im 45/45]
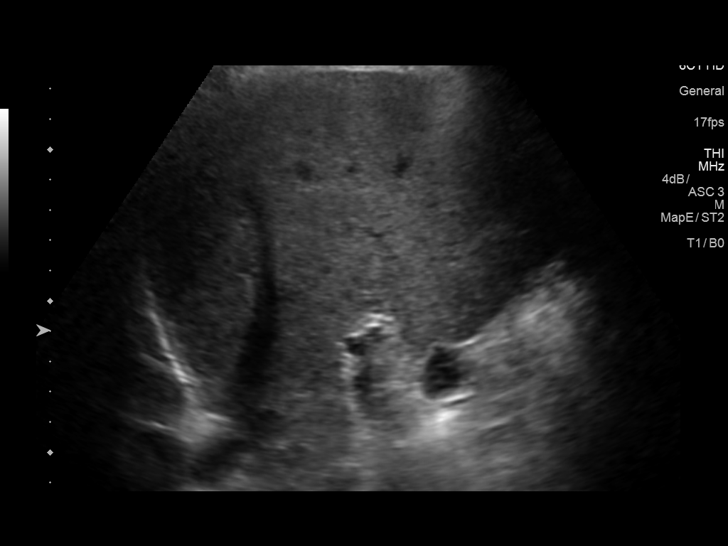

[14 of 25 positions shown; findings below may reference images not displayed]

FINDINGS: Gallbladder:

No gallstones or wall thickening visualized. No sonographic Murphy
sign noted by sonographer.

Common bile duct:

Diameter: 3.4 mm

Liver:

No focal lesion identified. Within normal limits in parenchymal
echogenicity. Portal vein is patent on color Doppler imaging with
normal direction of blood flow towards the liver.
IMPRESSION: Normal right upper quadrant ultrasound.

## 2019-05-01 ENCOUNTER — Other Ambulatory Visit: Payer: Self-pay

## 2019-05-01 ENCOUNTER — Ambulatory Visit: Payer: BC Managed Care – PPO

## 2019-05-01 ENCOUNTER — Other Ambulatory Visit (INDEPENDENT_AMBULATORY_CARE_PROVIDER_SITE_OTHER): Payer: BC Managed Care – PPO

## 2019-05-01 DIAGNOSIS — E785 Hyperlipidemia, unspecified: Secondary | ICD-10-CM

## 2019-05-01 DIAGNOSIS — Z23 Encounter for immunization: Secondary | ICD-10-CM | POA: Diagnosis not present

## 2019-05-01 DIAGNOSIS — Z79899 Other long term (current) drug therapy: Secondary | ICD-10-CM | POA: Diagnosis not present

## 2019-05-01 DIAGNOSIS — Z125 Encounter for screening for malignant neoplasm of prostate: Secondary | ICD-10-CM

## 2019-05-01 DIAGNOSIS — R7301 Impaired fasting glucose: Secondary | ICD-10-CM

## 2019-05-01 DIAGNOSIS — Z1159 Encounter for screening for other viral diseases: Secondary | ICD-10-CM | POA: Diagnosis not present

## 2019-05-01 LAB — CBC WITH DIFFERENTIAL/PLATELET
Basophils Absolute: 0 10*3/uL (ref 0.0–0.1)
Basophils Relative: 0.4 % (ref 0.0–3.0)
Eosinophils Absolute: 0.1 10*3/uL (ref 0.0–0.7)
Eosinophils Relative: 2.4 % (ref 0.0–5.0)
HCT: 43 % (ref 39.0–52.0)
Hemoglobin: 14.5 g/dL (ref 13.0–17.0)
Lymphocytes Relative: 30.4 % (ref 12.0–46.0)
Lymphs Abs: 1.7 10*3/uL (ref 0.7–4.0)
MCHC: 33.8 g/dL (ref 30.0–36.0)
MCV: 93.5 fl (ref 78.0–100.0)
Monocytes Absolute: 0.7 10*3/uL (ref 0.1–1.0)
Monocytes Relative: 11.7 % (ref 3.0–12.0)
Neutro Abs: 3.1 10*3/uL (ref 1.4–7.7)
Neutrophils Relative %: 55.1 % (ref 43.0–77.0)
Platelets: 249 10*3/uL (ref 150.0–400.0)
RBC: 4.6 Mil/uL (ref 4.22–5.81)
RDW: 13 % (ref 11.5–15.5)
WBC: 5.6 10*3/uL (ref 4.0–10.5)

## 2019-05-01 LAB — HEPATIC FUNCTION PANEL
ALT: 28 U/L (ref 0–53)
AST: 27 U/L (ref 0–37)
Albumin: 4.5 g/dL (ref 3.5–5.2)
Alkaline Phosphatase: 54 U/L (ref 39–117)
Bilirubin, Direct: 0.1 mg/dL (ref 0.0–0.3)
Total Bilirubin: 1.2 mg/dL (ref 0.2–1.2)
Total Protein: 7.1 g/dL (ref 6.0–8.3)

## 2019-05-01 LAB — BASIC METABOLIC PANEL
BUN: 15 mg/dL (ref 6–23)
CO2: 29 mEq/L (ref 19–32)
Calcium: 9.6 mg/dL (ref 8.4–10.5)
Chloride: 104 mEq/L (ref 96–112)
Creatinine, Ser: 1.12 mg/dL (ref 0.40–1.50)
GFR: 66.3 mL/min (ref >60.00)
Glucose, Bld: 112 mg/dL — ABNORMAL HIGH (ref 70–99)
Potassium: 4.8 mEq/L (ref 3.5–5.1)
Sodium: 141 mEq/L (ref 135–145)

## 2019-05-01 LAB — LDL CHOLESTEROL, DIRECT: Direct LDL: 100 mg/dL

## 2019-05-01 LAB — LIPID PANEL
Cholesterol: 189 mg/dL (ref 0–200)
HDL: 41.4 mg/dL (ref >39.00)
NonHDL: 147.19
Total CHOL/HDL Ratio: 5
Triglycerides: 211 mg/dL — ABNORMAL HIGH (ref 0.0–149.0)
VLDL: 42.2 mg/dL — ABNORMAL HIGH (ref 0.0–40.0)

## 2019-05-01 LAB — HEMOGLOBIN A1C: Hgb A1c MFr Bld: 6.1 % (ref 4.6–6.5)

## 2019-05-01 LAB — PSA: PSA: 0.19 ng/mL (ref 0.10–4.00)

## 2019-05-02 LAB — HEPATITIS C ANTIBODY
Hepatitis C Ab: NONREACTIVE
SIGNAL TO CUT-OFF: 0.03 (ref <1.00)

## 2019-05-06 MED ORDER — SIMVASTATIN 40 MG PO TABS: 40.0000 mg | ORAL_TABLET | Freq: Every evening | ORAL | 0 refills | Status: DC

## 2019-05-07 DIAGNOSIS — L821 Other seborrheic keratosis: Secondary | ICD-10-CM | POA: Diagnosis not present

## 2019-05-07 DIAGNOSIS — L578 Other skin changes due to chronic exposure to nonionizing radiation: Secondary | ICD-10-CM | POA: Diagnosis not present

## 2019-05-07 DIAGNOSIS — L819 Disorder of pigmentation, unspecified: Secondary | ICD-10-CM | POA: Diagnosis not present

## 2019-05-07 DIAGNOSIS — L570 Actinic keratosis: Secondary | ICD-10-CM | POA: Diagnosis not present

## 2019-05-07 DIAGNOSIS — D229 Melanocytic nevi, unspecified: Secondary | ICD-10-CM | POA: Diagnosis not present

## 2019-06-09 DIAGNOSIS — L578 Other skin changes due to chronic exposure to nonionizing radiation: Secondary | ICD-10-CM | POA: Diagnosis not present

## 2019-07-11 DIAGNOSIS — Z20828 Contact with and (suspected) exposure to other viral communicable diseases: Secondary | ICD-10-CM | POA: Diagnosis not present

## 2019-07-26 ENCOUNTER — Other Ambulatory Visit: Payer: Self-pay | Admitting: Internal Medicine

## 2019-10-02 DIAGNOSIS — H40023 Open angle with borderline findings, high risk, bilateral: Secondary | ICD-10-CM | POA: Diagnosis not present

## 2019-10-07 DIAGNOSIS — L570 Actinic keratosis: Secondary | ICD-10-CM | POA: Diagnosis not present

## 2019-10-17 ENCOUNTER — Other Ambulatory Visit: Payer: Self-pay | Admitting: Internal Medicine

## 2020-01-06 ENCOUNTER — Other Ambulatory Visit: Payer: Self-pay | Admitting: Internal Medicine

## 2020-01-06 NOTE — Telephone Encounter (Signed)
Last OV 11/29/2018 (Over a year) Last labs done on 05/01/2019  OK to fill and have patient come back around 04/30/2020 for CPE?

## 2020-01-08 NOTE — Telephone Encounter (Signed)
Yes    [place]

## 2020-02-13 MED ORDER — ACETAZOLAMIDE 125 MG PO TABS
ORAL_TABLET | ORAL | 0 refills | Status: AC
Start: 2020-02-13 — End: ?

## 2020-02-13 NOTE — Telephone Encounter (Signed)
Sent in electronically .  Acetazolamide: 125 mg orally every 12 hours  Duration: start day before ascent and continue 2-3 days at maximum altitude; may use once at night thereafter to improve sleep

## 2020-03-02 ENCOUNTER — Other Ambulatory Visit: Payer: Self-pay | Admitting: Internal Medicine

## 2020-03-30 ENCOUNTER — Other Ambulatory Visit: Payer: Self-pay | Admitting: Internal Medicine

## 2020-04-07 DIAGNOSIS — H40013 Open angle with borderline findings, low risk, bilateral: Secondary | ICD-10-CM | POA: Diagnosis not present

## 2020-04-13 DIAGNOSIS — L819 Disorder of pigmentation, unspecified: Secondary | ICD-10-CM | POA: Diagnosis not present

## 2020-04-13 DIAGNOSIS — L570 Actinic keratosis: Secondary | ICD-10-CM | POA: Diagnosis not present

## 2020-04-13 DIAGNOSIS — L814 Other melanin hyperpigmentation: Secondary | ICD-10-CM | POA: Diagnosis not present

## 2020-04-13 DIAGNOSIS — L821 Other seborrheic keratosis: Secondary | ICD-10-CM | POA: Diagnosis not present

## 2020-04-13 DIAGNOSIS — D229 Melanocytic nevi, unspecified: Secondary | ICD-10-CM | POA: Diagnosis not present

## 2020-05-11 ENCOUNTER — Ambulatory Visit (INDEPENDENT_AMBULATORY_CARE_PROVIDER_SITE_OTHER): Payer: BC Managed Care – PPO | Admitting: Internal Medicine

## 2020-05-11 ENCOUNTER — Encounter: Payer: Self-pay | Admitting: Internal Medicine

## 2020-05-11 ENCOUNTER — Other Ambulatory Visit: Payer: Self-pay

## 2020-05-11 VITALS — BP 138/72 | HR 72 | Temp 98.1°F | Ht 69.75 in | Wt 209.8 lb

## 2020-05-11 DIAGNOSIS — E785 Hyperlipidemia, unspecified: Secondary | ICD-10-CM | POA: Diagnosis not present

## 2020-05-11 DIAGNOSIS — Z23 Encounter for immunization: Secondary | ICD-10-CM | POA: Diagnosis not present

## 2020-05-11 DIAGNOSIS — R7301 Impaired fasting glucose: Secondary | ICD-10-CM

## 2020-05-11 DIAGNOSIS — Z0000 Encounter for general adult medical examination without abnormal findings: Secondary | ICD-10-CM

## 2020-05-11 DIAGNOSIS — Z125 Encounter for screening for malignant neoplasm of prostate: Secondary | ICD-10-CM | POA: Diagnosis not present

## 2020-05-11 NOTE — Patient Instructions (Signed)
Glad you are doing well .  Continue lifestyle intervention healthy eating and exercise .  Will notify you  of labs when available.   Ok  To get covid booster  Before travel.    Health Maintenance, Male Adopting a healthy lifestyle and getting preventive care are important in promoting health and wellness. Ask your health care provider about:  The right schedule for you to have regular tests and exams.  Things you can do on your own to prevent diseases and keep yourself healthy. What should I know about diet, weight, and exercise? Eat a healthy diet   Eat a diet that includes plenty of vegetables, fruits, low-fat dairy products, and lean protein.  Do not eat a lot of foods that are high in solid fats, added sugars, or sodium. Maintain a healthy weight Body mass index (BMI) is a measurement that can be used to identify possible weight problems. It estimates body fat based on height and weight. Your health care provider can help determine your BMI and help you achieve or maintain a healthy weight. Get regular exercise Get regular exercise. This is one of the most important things you can do for your health. Most adults should:  Exercise for at least 150 minutes each week. The exercise should increase your heart rate and make you sweat (moderate-intensity exercise).  Do strengthening exercises at least twice a week. This is in addition to the moderate-intensity exercise.  Spend less time sitting. Even light physical activity can be beneficial. Watch cholesterol and blood lipids Have your blood tested for lipids and cholesterol at 63 years of age, then have this test every 5 years. You may need to have your cholesterol levels checked more often if:  Your lipid or cholesterol levels are high.  You are older than 63 years of age.  You are at high risk for heart disease. What should I know about cancer screening? Many types of cancers can be detected early and may often be prevented.  Depending on your health history and family history, you may need to have cancer screening at various ages. This may include screening for:  Colorectal cancer.  Prostate cancer.  Skin cancer.  Lung cancer. What should I know about heart disease, diabetes, and high blood pressure? Blood pressure and heart disease  High blood pressure causes heart disease and increases the risk of stroke. This is more likely to develop in people who have high blood pressure readings, are of African descent, or are overweight.  Talk with your health care provider about your target blood pressure readings.  Have your blood pressure checked: ? Every 3-5 years if you are 69-65 years of age. ? Every year if you are 37 years old or older.  If you are between the ages of 91 and 53 and are a current or former smoker, ask your health care provider if you should have a one-time screening for abdominal aortic aneurysm (AAA). Diabetes Have regular diabetes screenings. This checks your fasting blood sugar level. Have the screening done:  Once every three years after age 97 if you are at a normal weight and have a low risk for diabetes.  More often and at a younger age if you are overweight or have a high risk for diabetes. What should I know about preventing infection? Hepatitis B If you have a higher risk for hepatitis B, you should be screened for this virus. Talk with your health care provider to find out if you are at risk  for hepatitis B infection. Hepatitis C Blood testing is recommended for:  Everyone born from [birth year] through 1965.  Anyone with known risk factors for hepatitis C. Sexually transmitted infections (STIs)  You should be screened each year for STIs, including gonorrhea and chlamydia, if: ? You are sexually active and are younger than 63 years of age. ? You are older than 63 years of age and your health care provider tells you that you are at risk for this type of infection. ? Your sexual  activity has changed since you were last screened, and you are at increased risk for chlamydia or gonorrhea. Ask your health care provider if you are at risk.  Ask your health care provider about whether you are at high risk for HIV. Your health care provider may recommend a prescription medicine to help prevent HIV infection. If you choose to take medicine to prevent HIV, you should first get tested for HIV. You should then be tested every 3 months for as long as you are taking the medicine. Follow these instructions at home: Lifestyle  Do not use any products that contain nicotine or tobacco, such as cigarettes, e-cigarettes, and chewing tobacco. If you need help quitting, ask your health care provider.  Do not use street drugs.  Do not share needles.  Ask your health care provider for help if you need support or information about quitting drugs. Alcohol use  Do not drink alcohol if your health care provider tells you not to drink.  If you drink alcohol: ? Limit how much you have to 0-2 drinks a day. ? Be aware of how much alcohol is in your drink. In the [place] one drink equals one 12 oz bottle of beer (355 mL), one 5 oz glass of wine (148 mL), or one 1 oz glass of hard liquor (44 mL). General instructions  Schedule regular health, dental, and eye exams.  Stay current with your vaccines.  Tell your health care provider if: ? You often feel depressed. ? You have ever been abused or do not feel safe at home. Summary  Adopting a healthy lifestyle and getting preventive care are important in promoting health and wellness.  Follow your health care provider's instructions about healthy diet, exercising, and getting tested or screened for diseases.  Follow your health care provider's instructions on monitoring your cholesterol and blood pressure. This information is not intended to replace advice given to you by your health care provider. Make sure you discuss any questions you have  with your health care provider. Document Revised: 07/10/2018 Document Reviewed: 07/10/2018 Elsevier Patient Education  2020 [employer].

## 2020-05-11 NOTE — Progress Notes (Signed)
Chief Complaint  Patient presents with  . Annual Exam    HPI: Patient  Carlos Gaines  64 y.o. comes in today for Preventive Health Care visit  And med check  He is doing quite well lost weight exercising eating better   BP is better     Takes statin  Otherwise  No gout attack ts  Sees derm  Every 6 mos  Traveled hiking [place] with diamox and did well  To go to [place] in December  No cp sob wheezing   Health Maintenance  Topic Date Due  . HIV Screening  Never done  . TETANUS/TDAP  11/18/2023  . COLONOSCOPY  01/17/2027  . INFLUENZA VACCINE  Completed  . COVID-19 Vaccine  Completed  . Hepatitis C Screening  Completed   Health Maintenance Review LIFESTYLE:  Exercise:  Active   Tobacco/ETS:n Alcohol: ocass Sugar beverages:n less sugar in diet  Sleep:good Drug use: no HH of  2  Bird  Work:   Retired     ROS:  GEN/ HEENT: No fever, significant weight changes sweats headaches vision problems hearing changes, CV/ PULM; No chest pain shortness of breath cough, syncope,edema  change in exercise tolerance. GI /GU: No adominal pain, vomiting, change in bowel habits. No blood in the stool. No significant GU symptoms. SKIN/HEME: ,no acute skin rashes suspicious lesions or bleeding. No lymphadenopathy, nodules, masses.  NEURO/ PSYCH:  No neurologic signs such as weakness numbness. No depression anxiety. IMM/ Allergy: No unusual infections.  Allergy .   REST of 12 system review negative except as per HPI   Past Medical History:  Diagnosis Date  . Asthma    hx allergy shots in [place]   . Basal cell carcinoma, lip    albertinei resection 2017  . Chicken pox   . GERD (gastroesophageal reflux disease)    endo neg x 2? on  chornnic ppi  . H/O cardiac catheterization    neg wu for sob  in ed   . Heart murmur    cardiac cath neg for sob   . Hyperlipidemia    orig  270 ranges  on meds for 10 years   . Hypertension    white coat   . Nephrolithiasis, uric acid    herrick no hx of  gout  fam hx of gout  . Retrocalcaneal bursitis? right  11/17/2013  . Uric acid stone in urine 01/14/2015  . UTI (lower urinary tract infection)    x 1 with fever     Past Surgical History:  Procedure Laterality Date  . CARDIAC CATHETERIZATION  2008  . shoulder repair  2009   2009/ right shoulder  . TONSILLECTOMY  1963   1963    Family History  Problem Relation Age of Onset  . Arthritis Father   . Hyperlipidemia Father   . Aneurysm Father   . Graves' disease Sister     Social History   Socioeconomic History  . Marital status: Married    Spouse name: Not on file  . Number of children: Not on file  . Years of education: Not on file  . Highest education level: Not on file  Occupational History  . Not on file  Tobacco Use  . Smoking status: Never Smoker  . Smokeless tobacco: Never Used  Vaping Use  . Vaping Use: Never used  Substance and Sexual Activity  . Alcohol use: Yes    Alcohol/week: 4.0 - 5.0 standard drinks    Types: 4 -  5 Standard drinks or equivalent per week  . Drug use: No  . Sexual activity: Not on file  Other Topics Concern  . Not on file  Social History Narrative   7 hours of sleep per night   hh of 2    Patient lives with his wife.   [name]:  Moved from [place].    Job Conservation officer, historic buildings ;. 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   Brother good health    Used to do weigh lifting now does som shot put    Social Determinants of Radio broadcast assistant Strain:   . Difficulty of Paying Living Expenses: Not on file  Food Insecurity:   . Worried About Charity fundraiser in the Last Year: Not on file  . Ran Out of Food in the Last Year: Not on file  Transportation Needs:   . Lack of Transportation (Medical): Not on file  . Lack of Transportation (Non-Medical): Not on file  Physical Activity:   . Days of Exercise per Week: Not on file  . Minutes of Exercise per Session: Not on file    Stress:   . Feeling of Stress : Not on file  Social Connections:   . Frequency of Communication with Friends and Family: Not on file  . Frequency of Social Gatherings with Friends and Family: Not on file  . Attends Religious Services: Not on file  . Active Member of Clubs or Organizations: Not on file  . Attends Archivist Meetings: Not on file  . Marital Status: Not on file    Outpatient Medications Prior to Visit  Medication Sig Dispense Refill  . simvastatin (ZOCOR) 40 MG tablet TAKE 1 TABLET BY MOUTH EVERY EVENING. PLEASE SCHEDULE PHYSICAL AROUND 04/30/20 OR AFTER 90 tablet 0  . VENTOLIN HFA 108 (90 Base) MCG/ACT inhaler USE 2 PUFFS EVERY 4 HOURS FOR SHORTNESS OF BREATH 18 Inhaler 0  . acetaZOLAMIDE (DIAMOX) 125 MG tablet 125 mg orally every 12 hours  start day before ascent and continue 2-3 days at maximum altitude; may use once at night thereafter to improve sleep (Patient not taking: Reported on 05/11/2020) 24 tablet 0  . fluocinonide-emollient (LIDEX-E) 0.05 % cream Apply 1 application topically 2 (two) times daily. (Patient not taking: Reported on 05/11/2020) 30 g 1   Facility-Administered Medications Prior to Visit  Medication Dose Route Frequency Provider Last Rate Last Admin  . 0.9 %  sodium chloride infusion  500 mL Intravenous Continuous Pyrtle, Lajuan Lines, MD         EXAM:  BP 138/72   Pulse 72   Temp 98.1 F (36.7 C)   Ht 5' 9.75" (1.772 m)   Wt 209 lb 12.8 oz (95.2 kg)   SpO2 97%   BMI 30.32 kg/m   Body mass index is 30.32 kg/m. Wt Readings from Last 3 Encounters:  05/11/20 209 lb 12.8 oz (95.2 kg)  12/25/17 213 lb 3.2 oz (96.7 kg)  11/26/17 208 lb 14.4 oz (94.8 kg)    Physical Exam: Vital signs reviewed QMG:QQPY is a well-developed well-nourished alert cooperative    who appearsr stated age in no acute distress.  HEENT: normocephalic atraumatic , Eyes: PERRL EOM's full, conjunctiva clear, Nares: paten,t no deformity discharge or tenderness.,  Ears: no deformity EAC's clear TMs with normal landmarks. Mouth masked  NECK: supple without masses, thyromegaly or bruits.  CHEST/PULM:  Clear to auscultation and percussion breath sounds equal no wheeze , rales or rhonchi. No chest wall deformities or tenderness. CV: PMI is nondisplaced, S1 S2 no gallops, murmurs, rubs. Peripheral pulses are full without delay.No JVD .  ABDOMEN: Bowel s Extremtities:  No clubbing cyanosis or edema, no acute joint swelling or redness no focal atrophy   Large   muscle mass   NEURO:  Oriented x3, cranial nerves 3-12 appear to be intact, no obvious focal weakness,gait within normal limits no abnormal reflexes or asymmetrical SKIN: No acute rashes normal turgor, color, no bruising or petechiae.  Derm rx erythema on right cheek PSYCH: Oriented, good eye contact, no obvious depression anxiety, cognition and judgment appear normal. LN: no cervical axillary inguinal adenopathy  Lab Results  Component Value Date   WBC 5.6 05/01/2019   HGB 14.5 05/01/2019   HCT 43.0 05/01/2019   PLT 249.0 05/01/2019   GLUCOSE 112 (H) 05/01/2019   CHOL 189 05/01/2019   TRIG 211.0 (H) 05/01/2019   HDL 41.40 05/01/2019   LDLDIRECT 100.0 05/01/2019   LDLCALC 67 11/26/2017   ALT 28 05/01/2019   AST 27 05/01/2019   NA 141 05/01/2019   K 4.8 05/01/2019   CL 104 05/01/2019   CREATININE 1.12 05/01/2019   BUN 15 05/01/2019   CO2 29 05/01/2019   TSH 3.96 11/14/2016   PSA 0.19 05/01/2019   HGBA1C 6.1 05/01/2019    BP Readings from Last 3 Encounters:  05/11/20 138/72  12/25/17 (!) 146/88  11/26/17 118/76    Lab plan review with patient   ASSESSMENT AND PLAN:  Discussed the following assessment and plan:    ICD-10-CM   1. Visit for preventive health examination  Z00.00 Lipid panel    Hepatic function panel    BASIC METABOLIC PANEL WITH GFR    CBC with Differential/Platelet    PSA    TSH    TSH    PSA    CBC with Differential/Platelet    BASIC METABOLIC PANEL WITH  GFR    Hepatic function panel    Lipid panel  2. Screening PSA (prostate specific antigen)  Z12.5 Lipid panel    Hepatic function panel    BASIC METABOLIC PANEL WITH GFR    CBC with Differential/Platelet    PSA    PSA    CBC with Differential/Platelet    BASIC METABOLIC PANEL WITH GFR    Hepatic function panel    Lipid panel  3. Hyperlipidemia, unspecified hyperlipidemia type  E78.5 Lipid panel    Hepatic function panel    BASIC METABOLIC PANEL WITH GFR    CBC with Differential/Platelet    PSA    TSH    TSH    PSA    CBC with Differential/Platelet    BASIC METABOLIC PANEL WITH GFR    Hepatic function panel    Lipid panel  4. Fasting hyperglycemia  R73.01 Lipid panel    Hepatic function panel    BASIC METABOLIC PANEL WITH GFR    CBC with Differential/Platelet    PSA    TSH    TSH    PSA    CBC with Differential/Platelet    BASIC METABOLIC PANEL WITH GFR    Hepatic function panel    Lipid panel  5. Needs flu shot  Z23 Flu Vaccine QUAD 6+ mos PF IM (Fluarix Quad PF)  get bmi below 30   Doing well   Good work out and not  Obese  By Tbody fat  But  Can still have goal below 20 bmi Return in about 1 year (around 05/11/2021) for preventive /cpx and medications.  Patient Care Team: Fayola Meckes, Standley Brooking, MD as PCP - General (Internal Medicine) Hillery Jacks, MD as Referring Physician (Dermatology) Patient Instructions  Glad you are doing well .  Continue lifestyle intervention healthy eating and exercise .  Will notify you  of labs when available.   Ok  To get covid booster  Before travel.    Health Maintenance, Male Adopting a healthy lifestyle and getting preventive care are important in promoting health and wellness. Ask your health care provider about:  The right schedule for you to have regular tests and exams.  Things you can do on your own to prevent diseases and keep yourself healthy. What should I know about diet, weight, and exercise? Eat a healthy  diet   Eat a diet that includes plenty of vegetables, fruits, low-fat dairy products, and lean protein.  Do not eat a lot of foods that are high in solid fats, added sugars, or sodium. Maintain a healthy weight Body mass index (BMI) is a measurement that can be used to identify possible weight problems. It estimates body fat based on height and weight. Your health care provider can help determine your BMI and help you achieve or maintain a healthy weight. Get regular exercise Get regular exercise. This is one of the most important things you can do for your health. Most adults should:  Exercise for at least 150 minutes each week. The exercise should increase your heart rate and make you sweat (moderate-intensity exercise).  Do strengthening exercises at least twice a week. This is in addition to the moderate-intensity exercise.  Spend less time sitting. Even light physical activity can be beneficial. Watch cholesterol and blood lipids Have your blood tested for lipids and cholesterol at 64 years of age, then have this test every 5 years. You may need to have your cholesterol levels checked more often if:  Your lipid or cholesterol levels are high.  You are older than 63 years of age.  You are at high risk for heart disease. What should I know about cancer screening? Many types of cancers can be detected early and may often be prevented. Depending on your health history and family history, you may need to have cancer screening at various ages. This may include screening for:  Colorectal cancer.  Prostate cancer.  Skin cancer.  Lung cancer. What should I know about heart disease, diabetes, and high blood pressure? Blood pressure and heart disease  High blood pressure causes heart disease and increases the risk of stroke. This is more likely to develop in people who have high blood pressure readings, are of African descent, or are overweight.  Talk with your health care provider  about your target blood pressure readings.  Have your blood pressure checked: ? Every 3-5 years if you are 29-69 years of age. ? Every year if you are 41 years old or older.  If you are between the ages of 34 and 60 and are a current or former smoker, ask your health care provider if you should have a one-time screening for abdominal aortic aneurysm (AAA). Diabetes Have regular diabetes screenings. This checks your fasting blood sugar level. Have the screening done:  Once every three years after age 24 if you are at a normal weight and have a low risk for diabetes.  More often and at a younger  age if you are overweight or have a high risk for diabetes. What should I know about preventing infection? Hepatitis B If you have a higher risk for hepatitis B, you should be screened for this virus. Talk with your health care provider to find out if you are at risk for hepatitis B infection. Hepatitis C Blood testing is recommended for:  Everyone born from [birth year] through 1964.  Anyone with known risk factors for hepatitis C. Sexually transmitted infections (STIs)  You should be screened each year for STIs, including gonorrhea and chlamydia, if: ? You are sexually active and are younger than 63 years of age. ? You are older than 63 years of age and your health care provider tells you that you are at risk for this type of infection. ? Your sexual activity has changed since you were last screened, and you are at increased risk for chlamydia or gonorrhea. Ask your health care provider if you are at risk.  Ask your health care provider about whether you are at high risk for HIV. Your health care provider may recommend a prescription medicine to help prevent HIV infection. If you choose to take medicine to prevent HIV, you should first get tested for HIV. You should then be tested every 3 months for as long as you are taking the medicine. Follow these instructions at home: Lifestyle  Do not use any  products that contain nicotine or tobacco, such as cigarettes, e-cigarettes, and chewing tobacco. If you need help quitting, ask your health care provider.  Do not use street drugs.  Do not share needles.  Ask your health care provider for help if you need support or information about quitting drugs. Alcohol use  Do not drink alcohol if your health care provider tells you not to drink.  If you drink alcohol: ? Limit how much you have to 0-2 drinks a day. ? Be aware of how much alcohol is in your drink. In the [place] one drink equals one 12 oz bottle of beer (355 mL), one 5 oz glass of wine (148 mL), or one 1 oz glass of hard liquor (44 mL). General instructions  Schedule regular health, dental, and eye exams.  Stay current with your vaccines.  Tell your health care provider if: ? You often feel depressed. ? You have ever been abused or do not feel safe at home. Summary  Adopting a healthy lifestyle and getting preventive care are important in promoting health and wellness.  Follow your health care provider's instructions about healthy diet, exercising, and getting tested or screened for diseases.  Follow your health care provider's instructions on monitoring your cholesterol and blood pressure. This information is not intended to replace advice given to you by your health care provider. Make sure you discuss any questions you have with your health care provider. Document Revised: 07/10/2018 Document Reviewed: 07/10/2018 Elsevier Patient Education  2020 [place] Izayah Miner M.D.

## 2020-05-12 LAB — BASIC METABOLIC PANEL WITH GFR
BUN: 17 mg/dL (ref 7–25)
CO2: 28 mmol/L (ref 20–32)
Calcium: 9.3 mg/dL (ref 8.6–10.3)
Chloride: 103 mmol/L (ref 98–110)
Creat: 1.15 mg/dL (ref 0.70–1.25)
GFR, Est African American: 78 mL/min/{1.73_m2} (ref >=60)
GFR, Est Non African American: 67 mL/min/{1.73_m2} (ref >=60)
Glucose, Bld: 89 mg/dL (ref 65–99)
Potassium: 4 mmol/L (ref 3.5–5.3)
Sodium: 140 mmol/L (ref 135–146)

## 2020-05-12 LAB — CBC WITH DIFFERENTIAL/PLATELET
Absolute Monocytes: 691 cells/uL (ref 200–950)
Basophils Absolute: 19 cells/uL (ref 0–200)
Basophils Relative: 0.3 %
Eosinophils Absolute: 90 cells/uL (ref 15–500)
Eosinophils Relative: 1.4 %
HCT: 43.8 % (ref 38.5–50.0)
Hemoglobin: 14.9 g/dL (ref 13.2–17.1)
Lymphs Abs: 1427 cells/uL (ref 850–3900)
MCH: 32.2 pg (ref 27.0–33.0)
MCHC: 34 g/dL (ref 32.0–36.0)
MCV: 94.6 fL (ref 80.0–100.0)
MPV: 10.2 fL (ref 7.5–12.5)
Monocytes Relative: 10.8 %
Neutro Abs: 4173 cells/uL (ref 1500–7800)
Neutrophils Relative %: 65.2 %
Platelets: 247 10*3/uL (ref 140–400)
RBC: 4.63 10*6/uL (ref 4.20–5.80)
RDW: 12.2 % (ref 11.0–15.0)
Total Lymphocyte: 22.3 %
WBC: 6.4 10*3/uL (ref 3.8–10.8)

## 2020-05-12 LAB — LIPID PANEL
Cholesterol: 131 mg/dL (ref <200)
HDL: 46 mg/dL (ref >=40)
LDL Cholesterol (Calc): 65 mg/dL (calc)
Non-HDL Cholesterol (Calc): 85 mg/dL (calc) (ref <130)
Total CHOL/HDL Ratio: 2.8 (calc) (ref <5.0)
Triglycerides: 112 mg/dL (ref <150)

## 2020-05-12 LAB — HEPATIC FUNCTION PANEL
AG Ratio: 1.7 (calc) (ref 1.0–2.5)
ALT: 18 U/L (ref 9–46)
AST: 21 U/L (ref 10–35)
Albumin: 4.5 g/dL (ref 3.6–5.1)
Alkaline phosphatase (APISO): 56 U/L (ref 35–144)
Bilirubin, Direct: 0.2 mg/dL (ref 0.0–0.2)
Globulin: 2.6 g/dL (calc) (ref 1.9–3.7)
Indirect Bilirubin: 0.8 mg/dL (calc) (ref 0.2–1.2)
Total Bilirubin: 1 mg/dL (ref 0.2–1.2)
Total Protein: 7.1 g/dL (ref 6.1–8.1)

## 2020-05-12 LAB — TSH: TSH: 2.35 mIU/L (ref 0.40–4.50)

## 2020-05-12 LAB — PSA: PSA: 0.17 ng/mL (ref <=4.0)

## 2020-05-12 NOTE — Progress Notes (Signed)
Results look good  Continue lifestyle intervention healthy eating and exercise .

## 2020-05-15 ENCOUNTER — Other Ambulatory Visit: Payer: Self-pay | Admitting: Family Medicine

## 2020-05-17 MED ORDER — ALBUTEROL SULFATE HFA 108 (90 BASE) MCG/ACT IN AERS: 1.0000 | INHALATION_SPRAY | Freq: Four times a day (QID) | RESPIRATORY_TRACT | 3 refills | Status: AC | PRN

## 2020-06-20 ENCOUNTER — Other Ambulatory Visit: Payer: Self-pay | Admitting: Internal Medicine

## 2020-06-21 ENCOUNTER — Other Ambulatory Visit: Payer: Self-pay | Admitting: Internal Medicine

## 2020-06-27 DIAGNOSIS — Z20822 Contact with and (suspected) exposure to covid-19: Secondary | ICD-10-CM | POA: Diagnosis not present

## 2020-08-24 ENCOUNTER — Other Ambulatory Visit: Payer: Self-pay | Admitting: Internal Medicine

## 2020-09-11 ENCOUNTER — Other Ambulatory Visit: Payer: Self-pay | Admitting: Internal Medicine

## 2020-09-20 DIAGNOSIS — D229 Melanocytic nevi, unspecified: Secondary | ICD-10-CM | POA: Diagnosis not present

## 2020-09-20 DIAGNOSIS — L821 Other seborrheic keratosis: Secondary | ICD-10-CM | POA: Diagnosis not present

## 2020-09-20 DIAGNOSIS — Z85828 Personal history of other malignant neoplasm of skin: Secondary | ICD-10-CM | POA: Diagnosis not present

## 2020-09-20 DIAGNOSIS — L570 Actinic keratosis: Secondary | ICD-10-CM | POA: Diagnosis not present

## 2020-09-20 DIAGNOSIS — L905 Scar conditions and fibrosis of skin: Secondary | ICD-10-CM | POA: Diagnosis not present

## 2020-10-25 ENCOUNTER — Other Ambulatory Visit: Payer: Self-pay

## 2020-10-25 MED ORDER — SIMVASTATIN 40 MG PO TABS: ORAL_TABLET | ORAL | 0 refills | Status: DC

## 2020-11-29 MED ORDER — SCOPOLAMINE 1 MG/3DAYS TD PT72: 1.0000 | MEDICATED_PATCH | TRANSDERMAL | 1 refills | Status: AC

## 2020-11-29 NOTE — Telephone Encounter (Signed)
Sent in.  Have a good trip.

## 2021-01-17 ENCOUNTER — Other Ambulatory Visit: Payer: Self-pay | Admitting: Internal Medicine
